# Patient Record
Sex: Female | Born: 1985 | Hispanic: Refuse to answer
Health system: Western US, Academic
[De-identification: ages and names within clinical notes are randomized; demographics above are authoritative.]

## PROBLEM LIST (undated history)

## (undated) DIAGNOSIS — J45909 Unspecified asthma, uncomplicated: Secondary | ICD-10-CM

## (undated) DIAGNOSIS — J309 Allergic rhinitis, unspecified: Secondary | ICD-10-CM

## (undated) HISTORY — DX: Unspecified asthma, uncomplicated: J45.909

## (undated) HISTORY — DX: Allergic rhinitis, unspecified: J30.9

---

## 2015-06-18 ENCOUNTER — Ambulatory Visit: Payer: No Typology Code available for payment source | Attending: Internal Medicine

## 2015-06-18 ENCOUNTER — Encounter (HOSPITAL_BASED_OUTPATIENT_CLINIC_OR_DEPARTMENT_OTHER): Payer: Self-pay

## 2015-06-18 VITALS — BP 120/60 | HR 98 | Ht 66.0 in | Wt 165.4 lb

## 2015-06-18 DIAGNOSIS — R87619 Unspecified abnormal cytological findings in specimens from cervix uteri: Secondary | ICD-10-CM | POA: Insufficient documentation

## 2015-06-18 DIAGNOSIS — Z0000 Encounter for general adult medical examination without abnormal findings: Secondary | ICD-10-CM | POA: Insufficient documentation

## 2015-06-18 DIAGNOSIS — J45909 Unspecified asthma, uncomplicated: Secondary | ICD-10-CM | POA: Insufficient documentation

## 2015-06-18 DIAGNOSIS — Z008 Encounter for other general examination: Secondary | ICD-10-CM

## 2015-06-18 DIAGNOSIS — F988 Other specified behavioral and emotional disorders with onset usually occurring in childhood and adolescence: Secondary | ICD-10-CM | POA: Insufficient documentation

## 2015-06-18 DIAGNOSIS — Z975 Presence of (intrauterine) contraceptive device: Secondary | ICD-10-CM | POA: Insufficient documentation

## 2015-06-18 NOTE — Progress Notes (Signed)
General Internal Medicine Clinic Note  New Encounter      DATE: 06/18/2015     Catherine Shea is a 30 year old female who is here today for management of ADD and to establish care.    CHIEF COMPLAINT:  Chief Complaint   Patient presents with   . Attention Deficit Disorder     Catherine Shea presents to clinic today with concerns that her ADD is contributing to her struggles with school.    She was first diagnosed with ADD at age 21. Prior to that she had be diagnosed with anxiety and was trialed on multiple different antidepressants. The antidepressants didn't seem to really help. Once she was diagnosed with ADD, she was on Adderall for at least 7 years. This seemed to work well for her and she was succeeding in school. She attended undergraduate and law school in [place], where she saw a psychiatrist at [facility] [place]. [place]. She was taking Adderall on week days and this seemed to address her issues with inattention.     She graduated Social worker school in 2012/09/10, at which time she stopped seeing a psychiatrist and stopped taking the Adderall. 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 She states that she was fired from two of the jobs for not being productive enough. 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 Initially she was doing okay in the program, however more recently she has started to struggle in the program. Her grades have started to slip and the boss of her internship has commented poorly on her performance. More specifically, she states that she is easily distracted from her homework. If she hits any road blocks or difficulty in her work, then she does other activities instead of trying to push past the difficulty. In class she has less of an issue because she has implemented coping mechanisms like handwriting her notes and keeping her computer/phone away from her desk. She denies issues with hyperactivity. The patient  notes that she exercised regularly in college and law school, and this seemed to help some. She has not exercised since starting her current program and cites the weather as a reason. 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    She denies anxiety and depression as contributors to her inattention. She states that she was briefly on prozac in 09-11-07 after a family member died. Currently she has some anxiety related to the fact that she is not doing well in school, bu otherwise is fine.    Today she is hoping to get re-established with a psychiatrist and potentially discuss medication options. She is wondering whether she should restart Adderall since this worked for her before. Of note, she plans to move to [place] for work once her program is done in June.      PAST MEDICAL HISTORY:  Past Medical History   Diagnosis Date   . Asthma    . Allergic rhinitis due to allergen        PROBLEM LIST:  Patient Active Problem List   Diagnosis   . Asthma and seasonal allergies   . mirena IUD, placed 02/2014   . Pap smear abnormality of cervix 02/2014   . ADD (attention deficit disorder)       ALLERGIES:  Review of patient's allergies indicates no known allergies.    MEDICATIONS:  Current Outpatient Prescriptions   Medication Sig Dispense Refill   . Budesonide-Formoterol Fumarate (SYMBICORT) 80-4.5 MCG/ACT Inhalation Aerosol Inhale 2 puffs by mouth.     . Cetirizine HCl 10 MG Oral Tab      . Levonorgestrel (MIRENA, 52 MG,) 20 MCG/24HR Intrauterine IUD 1 Intra Uterine Device by Intrauterine route One time.     . Multiple Vitamins-Minerals (MULTIVITAMIN ADULT OR)      . NAPROXEN OR        No current facility-administered medications for this visit.       Social History     Social History   . Marital Status: N/A     Spouse Name: N/A   . Number of Children: N/A   . Years of Education: N/A     Occupational History   . Not on file.     Social History Main Topics   . Smoking status: Never Smoker     . Smokeless tobacco: Not on file   . Alcohol Use: 1.2 oz/week     1 Glasses of wine, 1 Cans of beer per week   . Drug Use: No   . Sexual Activity:     Partners: Male     Birth Control/ Protection: LNG IUD     Other Topics Concern   . 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        Family History   Problem Relation Age of Onset   . Hypertension Mother    . Diabetes Mother    . Cancer Maternal Grandfather      lung cancer     REVIEW OF SYSTEMS:  The remaining review of systems was reviewed and is negative.    PHYSICAL EXAM:  BP 120/60 mmHg  Pulse 98  Ht 5\' 6"  (1.676 m)  Wt 165 lb 6.4 oz (75.025 kg)  BMI 26.71 kg/m2  SpO2 100%:  General: well-appearing, no distress  Psych: somewhat anxious about presenting symptom, otherwise normal mood and affect  HEENT: clear conjunctiva, oropharynx clear, no LAD  Lungs: breathing comfortably on RA, CTAB  CV: tachycardic but regular, S1 and S2 appreciated, no m/r/g  Abd: soft, nontender, no organomegaly   MSK: nl muscle bulk and tone  Neuro: normal gait    PHQ-9: 4  GAD-7: 3        ASSESSMENT AND PLAN:  Catherine Shea is a 30yo F who presents primarily to address poor school performance related to her known diagnosis of ADD. She requests referral to psychiatry for discussion regarding her diagnosis and medication management. In the meantime, I suggest that she find ways to exercise and bring some of her coping mechanisms that she uses at school into the home. We brainstormed going to the [place] and/or running with her dog as possible exercise options.     ADD (attention deficit disorder)  -     REFERRAL TO ADD/ADHD ADULTS  -     Obtain records from [facility]    Hx abnormal pap        -      pt will make appt to return to clinic for papsmear        -      obtain [facility]yn records [facility]    Healthcare maintenance  -     GC&CHLAM NUCLEIC ACID DETECTN  -     HIV ANTIGEN AND ANTIBODY SCRN

## 2015-06-18 NOTE — Patient Instructions (Signed)
Thank you for visiting with me today. Here are the things I recommend from today's visit:    1. ADD: referral to ADD clinic. In the meantime, please start exercising for 30 minutes 3x per week. [facility]    *If your referral to [facility] does not go through, please call [facility] and make an appointment there. *    2. Abnormal pap: please make appointment to return in 1-2 weeks    3. Healthcare maintenance:  -STD screening today    -----------------------------------------------------------------------------  Here are a few tips to help navigate your healthcare needs:     Refills:  Call your pharmacy at least 4 working days before you run out. Do not call the clinic for refills, it's quicker and safer to go through your pharmacy.     Test Results: Available in 1-2 weeks. I will contact you by eCare or letter unless there is something urgent, in which case I will call you sooner.     Urgent Symptoms:  Call [phone], day or night, and select option 8. Our clinic staff will help you during regular hours; after hours, our on-call nurses will help you.     Other Questions: Use eCare to securely message me. Please note that e-care messages are only read during office hours. If you have a long or complex question or a new issue, please make an appointment.  Call [phone] to sign-up for eCare or ask your MA to sign you up today.

## 2015-06-19 LAB — GC&CHLAM NUCLEIC ACID DETECTN
Chlam Trachomatis Nucleic Acid: NEGATIVE
N.Gonorrhoeae(GC) Nucleic Acid: NEGATIVE

## 2015-06-20 NOTE — Progress Notes (Signed)
-------------------------------------------    Attending: [place] L Chibueze Beasley, MD  I have personally discussed the case with Dr. CARR, STEPHANIE ANN   during or immediately after the patient visit including review of history, physical exam, diagnosis and treatment plan.    -------------------------------------------

## 2015-06-21 LAB — HIV ANTIGEN AND ANTIBODY SCRN
HIV Antigen and Antibody Interpretation: NONREACTIVE
HIV Antigen and Antibody Result: NONREACTIVE

## 2015-06-24 ENCOUNTER — Telehealth (INDEPENDENT_AMBULATORY_CARE_PROVIDER_SITE_OTHER): Payer: Self-pay | Admitting: Medical

## 2015-06-24 NOTE — Telephone Encounter (Signed)
Left message for patient to return call to clinic.    CCR: Please assist patient in scheduling CONSULT with Fuller PlanNeysa Koury for ADD/ADHD. Neysa only does one consult per day and at time of call, first available is not until May 1st. Please attach referral to appointment. Thanks!

## 2015-06-24 NOTE — Telephone Encounter (Signed)
Pt has a referral for an ADD/ADHD Consult with Neysa Koury.    Please assist in scheduling and attach referral to appt, thanks!

## 2015-06-25 NOTE — Telephone Encounter (Signed)
Patient scheduled, closing TE.

## 2015-06-28 ENCOUNTER — Encounter (HOSPITAL_BASED_OUTPATIENT_CLINIC_OR_DEPARTMENT_OTHER): Payer: Self-pay

## 2015-07-02 ENCOUNTER — Ambulatory Visit: Payer: No Typology Code available for payment source | Attending: Internal Medicine

## 2015-07-02 ENCOUNTER — Other Ambulatory Visit (HOSPITAL_BASED_OUTPATIENT_CLINIC_OR_DEPARTMENT_OTHER): Payer: Self-pay | Admitting: Internal Medicine

## 2015-07-02 ENCOUNTER — Telehealth (HOSPITAL_BASED_OUTPATIENT_CLINIC_OR_DEPARTMENT_OTHER): Payer: Self-pay

## 2015-07-02 VITALS — BP 108/68 | HR 100 | Wt 165.0 lb

## 2015-07-02 DIAGNOSIS — Z87898 Personal history of other specified conditions: Secondary | ICD-10-CM | POA: Insufficient documentation

## 2015-07-02 DIAGNOSIS — Z1151 Encounter for screening for human papillomavirus (HPV): Secondary | ICD-10-CM | POA: Insufficient documentation

## 2015-07-02 DIAGNOSIS — Z5189 Encounter for other specified aftercare: Secondary | ICD-10-CM

## 2015-07-02 DIAGNOSIS — Z8742 Personal history of other diseases of the female genital tract: Secondary | ICD-10-CM

## 2015-07-02 LAB — PR URINE PREGNANCY TEST HCG, ONSITE: Pregnancy (HCG) (UWNC), URN: NEGATIVE

## 2015-07-02 NOTE — Patient Instructions (Signed)
#   Papsmear and pregnancy test:  Please sign up for ecare so that I can send you the test results electronically. Otherwise, I can go ahead and call you with results.

## 2015-07-02 NOTE — Progress Notes (Signed)
[facility]   Clinic Note      CC:  30yo with history of abnormal pap smear and mirena IUD who presents for repeat pap smear.     HPI:      # History abnormal pap smear:  [facility] We have yet to receive the records from this study. It was recommended at that time to repeat in 1 year, which is about now.    # Shoulder impingement: Has started exercising again, which has included some weight lifting with her upper body. Has been noticing a nerve like, shooting sensation of her lateral shoulder that primarily occurs with movements that require lifting her arm over her head.    PHYSICAL EXAM:   Filed Vitals:    07/02/15 1500   BP: 108/68   Pulse: 100   Weight: 165 lb (74.844 kg)   General: well-appearing, no distress  HEENT: clear conjunctiva  Lungs: breathing comfortably on RA  CV: warm and well perfused  GU: normal appearing external genitalia, speculum exam with normal appearing cervix, no abnormal discharge, unable to visualize IUD string in cervical os  MSK: L shoulder with good range of motion with active flexion, external and internal rotation, shoulder pain elicited with lift off test, no pain with resisted external rotation or empty can. Positive Neer and Hawkin maneuvers       IMPRESSION/PLAN:    30yo with history of abnormal pap smear and mirena IUD who presents for repeat pap smear.     # History of abnormal papsmear:  -Will redo record release form to [facility]  -Repeat pap smear today  -Pt will sign up for e-care to discuss results    # Mirena IUD: I was unable to visualize the string on cervical examination today. The most likely reason is that the string is curled up in the cervical os. I discussed this with the patient today. She has not had periods since the IUD was placed, and thus it is unlikely that the IUD has migrated or dislodged. Urine pregnancy test is negative today. We elected to forgo further evaluation with transvaginal ultrasound,  however she will let me know if she would like to have this done to ensure location of the IUD    # Acute L shoulder impingement syndrome: Encouraged rest and NSAIDs until improves. If persists, can consider referral to PT.       Owens LofflerStephanie Taos Tapp MD, MBA  Resident Physician  [facility]    This patient's assessment and plan was reviewed with attending physician, Dr. Winfred LeedsFouch

## 2015-07-02 NOTE — Telephone Encounter (Signed)
Unable to find ROI signed by pt at her last visit here, or any faxed records requests in [facility] fax. Pt signed another release today while in clinic. She needs records from two places:    [facility]  Ph: [phone]  Fax # not listed online, and their office is closed for the day. Website says they need records requests mailed to: [facility] Information Management, Mail stop [phone]  [facility] [address], [place], [place] [id number].  Needed: Last Pap & path results, done about 1.626yrs ago    [facility]    Ph: [phone]  Fax: [phone]  Needed: Notes related to ADHD dx/tx    I faxed a copy of ROI to [facility], and mailed the original to OB/GYN.    [facility] MA, please leave TE open until receipt of records is confirmed. I sent a copy of ROI to scanning.    Routing:  Dr. Lafayette Dragonarr  [facility] MA

## 2015-07-02 NOTE — Progress Notes (Signed)
I have personally discussed the case with the resident during or immediately after the patient visit including review of history, physical exam, diagnosis, and treatment plan. I agree with the assessment and plan of care.

## 2015-07-05 LAB — HPV ONLY

## 2015-07-06 LAB — CERVICAL CANCER SCREENING: Cytologic Impression: NEGATIVE

## 2015-07-07 ENCOUNTER — Telehealth (HOSPITAL_BASED_OUTPATIENT_CLINIC_OR_DEPARTMENT_OTHER): Payer: Self-pay

## 2015-07-07 NOTE — Telephone Encounter (Signed)
[facility] MA,    Please call patient and inform her that she had a normal pap smear. I need to look at her records when I am next in clinic this upcoming Friday to determine when she needs repeat testing.    Also, please encourage her to sign up for ecare so we can discuss this over that venue. Thanks.

## 2015-07-07 NOTE — Telephone Encounter (Signed)
Gave the patient the attached message from Dr Lafayette Dragonarr.  She has the information to sign up for ecare

## 2015-07-16 NOTE — Telephone Encounter (Signed)
Called [phone] for medical records. That number is for special [facility]BG[facility]. Patient was seen at regular OBGYN. They gave me the correct fax and phone #:    [phone]  Fax.[phone]    Re-sending the ROI and said it was urgent to get the records today.    Routing:  Dr. Lafayette Dragonarr  [facility] MA

## 2015-07-22 NOTE — Telephone Encounter (Signed)
Dr. Lafayette Dragonarr, have you received these records?

## 2015-07-22 NOTE — Telephone Encounter (Signed)
When I looked in my box on Friday, there was a note that said we needed to call the OB/GYN office directly. I believe I left the note in the box if you want to take a look at it.     I definitely need these records. If you are unable to get them, can you call the patient and have her try as well.

## 2015-07-22 NOTE — Telephone Encounter (Signed)
RN phoned facility [phone]), actually spoke with a person who is able to assist.  She is faxing this today.  Unfortunately shortly after the conversation, our main fax machine [phone]) failed.    RN phoned facility later in the day, it is now being faxed to our rerouted fax machine  [phone], Team [facility])    Will watch for this fax, place in Dr. Dorothy Sparkarr's mailbox.    Routed to Dr. Lafayette Dragonarr (PCP, Update)

## 2015-08-03 NOTE — Telephone Encounter (Signed)
Left VM at [phone] requesting colposcopy results.

## 2015-08-03 NOTE — Telephone Encounter (Signed)
Obtained pap smear results from 02/2014. Result: ASCUS    Team [facility] MA, I still need her colposcopy results (pathology) and notes from her gyn appointment. Please call back and try to obtain these records.    From what the patient told me, she had a normal colposcopy result. If this is truly the case, then she is due for repeat pap smear in 3 years.

## 2015-08-05 NOTE — Telephone Encounter (Signed)
Dr Lafayette Dragonarr did you receive the Colposcopy results?

## 2015-08-05 NOTE — Telephone Encounter (Signed)
I will not be back in clinic for another 1-2 weeks. Can you please check my mailbox, and if it is not there, please request again.

## 2015-08-05 NOTE — Telephone Encounter (Addendum)
Contacted [facility] and spoke to [place] [phone].  She will fax the Colposcopy pathology report to [facility] Fax attn Dr Lafayette Dragonarr. The clinic note will need to be requested from Ph [phone] to [facility] Fax as well, but they were closed today when I called    MA: Please call for this OV clinic note on 4/21    Postpone until 4/21

## 2015-08-16 ENCOUNTER — Encounter (INDEPENDENT_AMBULATORY_CARE_PROVIDER_SITE_OTHER): Payer: Self-pay | Admitting: Medical

## 2015-08-16 ENCOUNTER — Ambulatory Visit (INDEPENDENT_AMBULATORY_CARE_PROVIDER_SITE_OTHER): Payer: No Typology Code available for payment source | Admitting: Medical

## 2015-08-16 VITALS — BP 121/79 | HR 91 | Temp 98.4°F | Resp 14 | Wt 170.0 lb

## 2015-08-16 DIAGNOSIS — F988 Other specified behavioral and emotional disorders with onset usually occurring in childhood and adolescence: Secondary | ICD-10-CM

## 2015-08-16 MED ORDER — AMPHETAMINE-DEXTROAMPHETAMINE 20 MG OR TABS
20.0000 mg | ORAL_TABLET | Freq: Two times a day (BID) | ORAL | Status: DC
Start: 2015-08-16 — End: 2015-08-17

## 2015-08-16 NOTE — Progress Notes (Signed)
Chronic Pain Urine Temperature:       Urine Temperature was 96

## 2015-08-16 NOTE — Progress Notes (Signed)
Catherine Shea is a 30 year old female who presents for evaluation of possible   ADHD.      Who requested this evaluation? Self     Why is evaluation being requested?   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 YES: 2009     diagnosed by psychiatrist in [place]   Took Adderall for 1-2 years   She graduated from Social worker school in 2014 and then stopped taking Adderall       Any prior educational evaluation? NO    History:    Developmental History: Patient unsure of exact developmental ages, but states that there was no concern about developmental landmarks.     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 NO                        Past Psychiatric History:  YES: has been treated twice for depression and anxiety        Social history: Who lives at home? Alone       Recent move, change in work: NO    Recent change in family structure: NO    Recent significant life events: YES:  moved to [place] from [place] in September        Any history of physical or sexual abuse: NO    ROS:     Ability to attend to details? At times can be too focused on details        Problems with careless mistakes? YES: especially because she puts work off until rigt before it's due          Ability to sustain attention to school/work? No        Forgetfulness? NO  Ability to complete activities? NO-not work related Conservation officer, natureprojects       Organization skills? Good       Lose things? YES: keys          Distractibility? YES: noises - people breathing and chewing          Good listener? YES but has to actively think about listening   Has to take notes       Ability to follow instructions? YES      Fidgeting/excess physical activity?ocasionaly            Talkative? NO          Ability to wait in lines, take turns? YES      Ability to take turns in conversations? YES      Answers before question completed? Mainly with family     Symptoms do not impact social lie   At home she occasionally has a hard time completing tasks          PE:   BP 121/79 mmHg  Pulse 91  Temp(Src) 98.4 F (36.9 C) (Temporal)  Resp 14  Wt 170 lb (77.111 kg)  SpO2 100%  General appearance: healthy, alert, no distress  PSYCHIATRIC     *Memory: grossly normal   *Mood/affect: tearful   *Thought process: intact   *Grooming: casual   *Eye Contact: excellent   *Speech: fluent   *Hygiene: normal        ASRS Completed: YES    PHQ-9: 8    A/P:  Catherine Shea does not meet the criteria for ADD as she did not have symptoms prior to age 30, she has a low ASRS score and does not have symptoms that impact multiple aspects of her life. Initially, I suggested she see an ADD provider in the community and if that person believes she meets criteria, I am happy to do her med management.  At this suggestion, she became quite tearful.  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  We will meet again in a month to discuss how she is doing.  In the meantime, she will set up an appointment with one of the local ADD providers on the list I provided.  I would like to get another providers insight on her diagnosis     Begin Adderall 20 mgs BID. Discussed medication dosage, usage, goals of therapy, and side effects.  Recommended eating  breakfast 30 min before taking medicine to help with appetite suppression.

## 2015-08-16 NOTE — Progress Notes (Signed)
Reason for Visit: See chief complaint     Refills? NO  Referral? NO  Letter or Form? NO  Lab Results? NO    HEALTH MAINTENANCE:  Has the patient has this done since their last visit?  Cervical screening/PAP: 07-02-15  Mammo: N/A  Colon Screen: N/A  Diabetic Eye Exam (If applicable): N/A      Have you seen a specialist since your last visit: No    Vaccines Due? No    PHQ2 done in the last year (365 days)? yes    Does patient have eCare?  Code given    HM Due:   Health Maintenance   Topic Date Due   . Tetanus Vaccine  07/13/1999   . Pap Smear  07/02/2018   . Influenza Vaccine  Completed   . HIV Screen  Completed       PCP Verified?  Yes, Lafayette Dragonarr

## 2015-08-17 ENCOUNTER — Telehealth (INDEPENDENT_AMBULATORY_CARE_PROVIDER_SITE_OTHER): Payer: Self-pay | Admitting: Medical

## 2015-08-17 ENCOUNTER — Other Ambulatory Visit (INDEPENDENT_AMBULATORY_CARE_PROVIDER_SITE_OTHER): Payer: Self-pay | Admitting: Medical

## 2015-08-17 DIAGNOSIS — R41840 Attention and concentration deficit: Secondary | ICD-10-CM

## 2015-08-17 LAB — STANDARD DRUG SCREEN, URN
Acetaminophen Qualitative, URN: NEGATIVE
Alcohol (Ethyl), URN: NEGATIVE mg/dL
Amphet/Methamphetamine Qual,URN: NEGATIVE
Barbiturate (Qual), URN: NEGATIVE
Benzodiazepines (Qual), URN: NEGATIVE
Cannabinoids (Qual), URN: NEGATIVE
Cocaine (Qual), URN: NEGATIVE
Methadone (Qual), URN: NEGATIVE
Opiates (Qual), URN: NEGATIVE
Phencyclidine (Qual), URN: NEGATIVE
Tricyclic Antidepressants, URN: NEGATIVE

## 2015-08-17 MED ORDER — AMPHETAMINE-DEXTROAMPHET ER 20 MG OR CP24
20.0000 mg | EXTENDED_RELEASE_CAPSULE | Freq: Every day | ORAL | Status: DC
Start: 2015-08-17 — End: 2015-09-15

## 2015-08-17 NOTE — Telephone Encounter (Signed)
I will place a prescription for the patient at the front desk for Adderall XR Please ask her to bring in the prescription I provided yesterday to be destroyed   Her insurance does not cover Adderall IR

## 2015-08-17 NOTE — Telephone Encounter (Signed)
Please call pharmacy and confirm PA is needed. Yes PA is needed per pharmacy     If PA is required what are the formulary alternatives. Adderall XR will not need a PA     Please also obtain:    Medication Name: Adderall 20 mg   PA Department Name: .  Prior Auth Dept phone number: [phone]  PCN:   BIN:  Member ID:  Prescriber Fuller PlanNeysa Koury   Prescriber NPI [id number]  Pharmacy Name: [facility]   Pharmacy #: [phone]    If formulary alternatives please route message to prescriber to see if med can be changed. If none please route message to PA pool to start PA.

## 2015-08-17 NOTE — Telephone Encounter (Signed)
(  TEXTING IS AN OPTION FOR [facility] CLINICS ONLY)  Is this a [facility] clinic? Yes. What is the mobile number we can use to get a hold of you via text? [phone]      RETURN CALL: Detailed message on voicemail only      SUBJECT:  Medication Management/Questions     MEDICATION(S): Amphetamine-Dextroamphetamine (ADDERALL) 20 MG Oral Tab  CONCERNS/QUESTIONS: Patient stated her insurance company, [employer] is requiring a prior-authorization for this medication.  ADDITIONAL INFORMATION: N/A

## 2015-08-17 NOTE — Telephone Encounter (Signed)
LMTCB otherwise will try again tomorrow    Plan: [facility]    CCR: Okay to transfer to me at [phone] until [phone]

## 2015-08-18 ENCOUNTER — Telehealth (INDEPENDENT_AMBULATORY_CARE_PROVIDER_SITE_OTHER): Payer: Self-pay | Admitting: Medical

## 2015-08-18 DIAGNOSIS — R41840 Attention and concentration deficit: Secondary | ICD-10-CM

## 2015-08-18 NOTE — Telephone Encounter (Signed)
(  TEXTING IS AN OPTION FOR [facility] CLINICS ONLY)  Is this a [facility] clinic? Yes. Patient declined the option to receive mobile text messages.      RETURN CALL: Detailed message on voicemail only      SUBJECT:  General Message     REASON FOR REQUEST: Prior authorization    MESSAGE: Patient picked up the hard copy prescription for the Adderall today and took it to the pharmacy and it was rejected again as needing prior authorization. Patient would like to go forward with the prior authorization so she can receive the medication and states that the pharmacy should have faxed the clinic regarding this. Patient requests a call back from the clinic to discuss this further, thanks.

## 2015-08-18 NOTE — Telephone Encounter (Signed)
Patient notified Rx hard copy is at the front desk ready for pick up. Patient stated she would stop by the clinic to pick up. (pt notified Rx cannot be faxed to pharmacy)    Closing TE

## 2015-08-18 NOTE — Telephone Encounter (Signed)
Received call from pt, stated pharmacy has original rx, will stop by to pick up new hard copy.     I informed pharmacy to shred old hard copy prescription, pt will pick-up new one.     Closing TE.

## 2015-08-18 NOTE — Telephone Encounter (Signed)
(  TEXTING IS AN OPTION FOR [facility] ONLY)  Is this a [facility] clinic? Yes. What is the mobile number we can use to get a hold of you via text? [phone]      RETURN CALL: Detailed message on voicemail only      SUBJECT:  Medication Management/Questions     MEDICATION(S): Amphetamine-Dextroamphet ER (ADDERALL XR) 20 MG Oral CAPSULE SR 24 HR  CONCERNS/QUESTIONS:Requesting new prescription hard copy faxed to [phone]. Please call    ADDITIONAL INFORMATION: [facility] [phone] [address] [place] [phone]

## 2015-08-18 NOTE — Telephone Encounter (Addendum)
Prior Authorization required for:  Amphetamine-Dextroamphet ER (ADDERALL XR) 20 MG- spoke to patient and confirmed she picked up Rx yesterday using a coupon code. Disregard Prior Auth. Pt does not need a PA since discount was reasonable.     Closing TE

## 2015-09-15 ENCOUNTER — Encounter (INDEPENDENT_AMBULATORY_CARE_PROVIDER_SITE_OTHER): Payer: Self-pay | Admitting: Medical

## 2015-09-15 ENCOUNTER — Ambulatory Visit (INDEPENDENT_AMBULATORY_CARE_PROVIDER_SITE_OTHER): Payer: No Typology Code available for payment source | Admitting: Medical

## 2015-09-15 VITALS — BP 124/79 | HR 96 | Temp 99.4°F | Resp 14 | Wt 164.0 lb

## 2015-09-15 DIAGNOSIS — F988 Other specified behavioral and emotional disorders with onset usually occurring in childhood and adolescence: Secondary | ICD-10-CM

## 2015-09-15 MED ORDER — AMPHETAMINE-DEXTROAMPHETAMINE 10 MG OR TABS
ORAL_TABLET | ORAL | 0 refills | Status: AC
Start: 2015-10-15 — End: ?

## 2015-09-15 MED ORDER — AMPHETAMINE-DEXTROAMPHETAMINE 10 MG OR TABS
ORAL_TABLET | ORAL | 0 refills | Status: DC
Start: 2015-09-15 — End: 2015-09-15

## 2015-09-15 NOTE — Progress Notes (Signed)
Reason for Visit: See chief complaint     Refills? YES  Referral? NO  Letter or Form? NO  Lab Results? NO    HEALTH MAINTENANCE:  Has the patient has this done since their last visit?  Cervical screening/PAP: 07-02-15  Mammo: N/A  Colon Screen: N/A  Diabetic Eye Exam (If applicable): N/A      Have you seen a specialist since your last visit: No    Vaccines Due? No    PHQ2 done in the last year (365 days)? yes    Does patient have eCare?  no    HM Due:   Health Maintenance   Topic Date Due   . Tetanus Vaccine  07/13/1999   . Pap Smear  07/02/2018   . Influenza Vaccine  Completed   . HIV Screen  Completed       PCP Verified?  Yes, carr

## 2015-09-15 NOTE — Progress Notes (Signed)
Catherine CanesChristine Peggye FormLynn Wessells is a 30 year old female who comes in today for a chief concern of ADD/ADHD follow-up.       Current Medication(s): Adderall XR 20 mgs   Overall Catherine CanesChristine is doing well   She would like to try IR for more dosing control and less sleep issues   Sleep: has difficulty falling asleep the days she takes Adderall   Diet and weight: normal   School: preparing to defend thesis   Able to meet deadlines   Tics/SE:none     Review of patient's allergies indicates:  No Known Allergies    OBJECTIVE:  BP 124/79   Pulse 96   Temp 99.4 F (37.4 C) (Temporal)   Resp 14   Wt 164 lb (74.4 kg)   SpO2 100%   BMI 26.47 kg/m   General appearance: healthy, alert, no distress   Neuro: Alert interactive and appropriate for age  Normal mood and affect     A/P:     1. Moving to [place] in about 2 weeks - will find local provider

## 2018-06-19 IMAGING — CR XR ABDOMEN 1 VIEW
1 series · 2 of 2 positions shown · non-contrast
Comparison: none

Exam:KUB.
REASON FOR EXAM: Abdominal pain.

[Series 710: AP · 0.19mm/px · 2 of 2 slices shown]
[im 1/2]
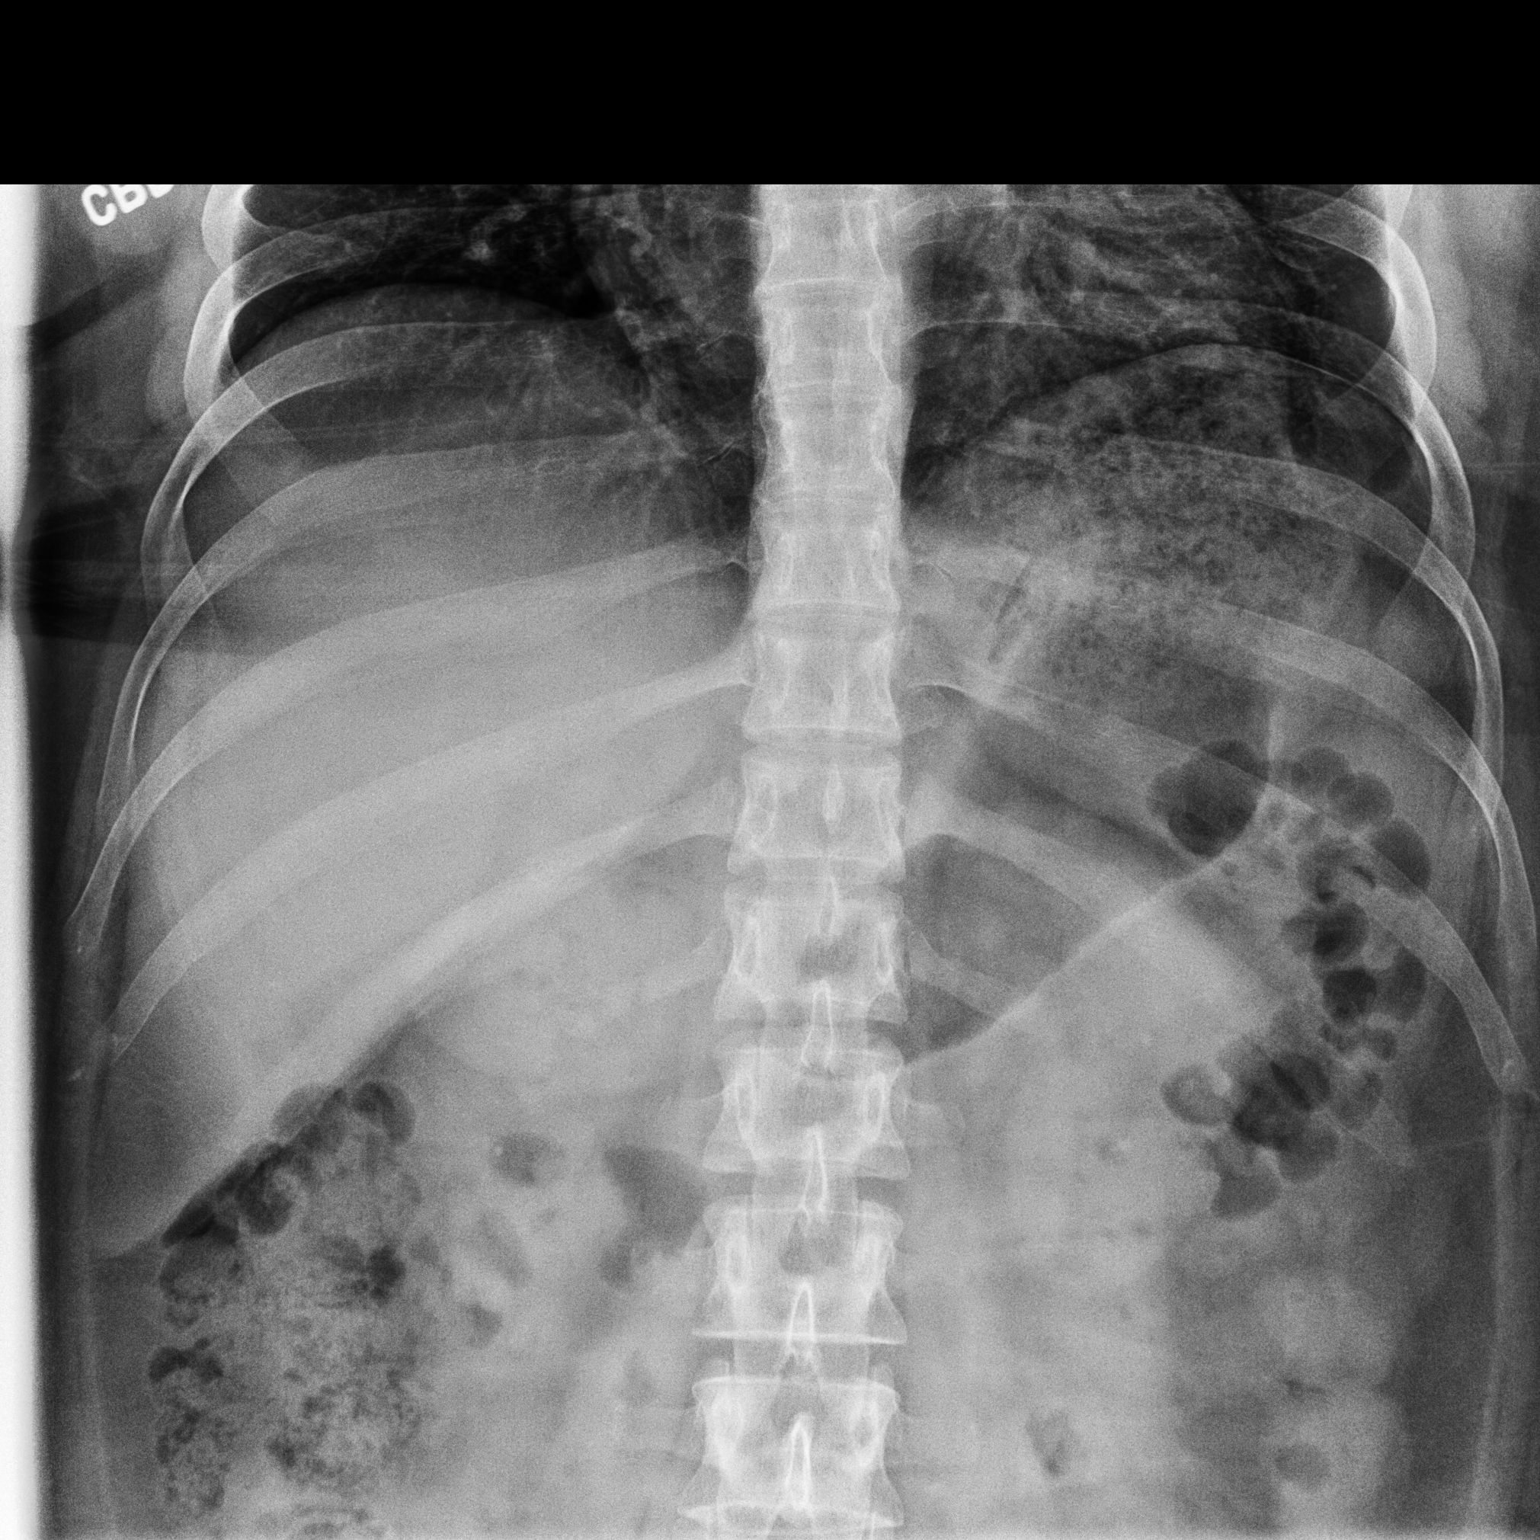
[im 2/2]
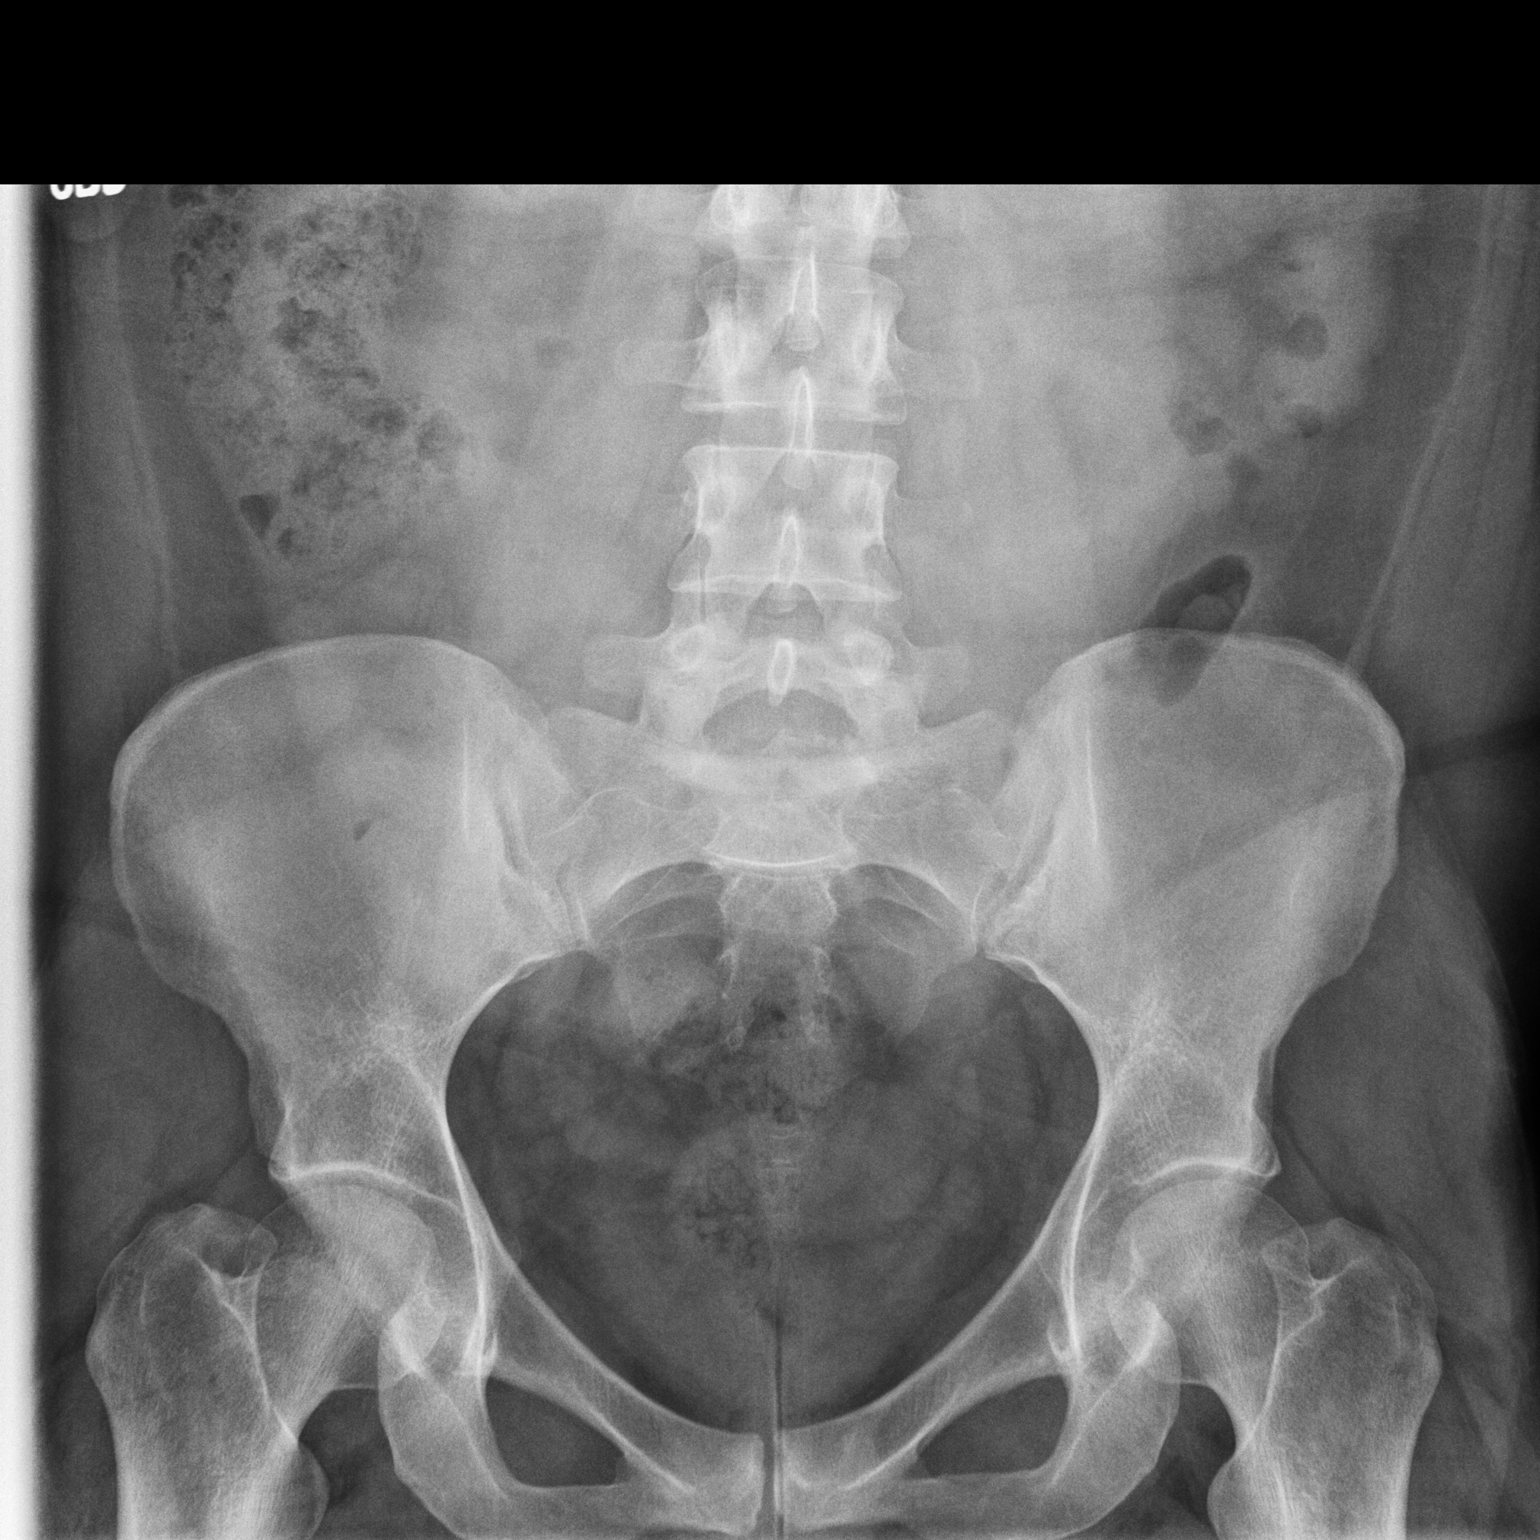

[2 of 2 positions shown; findings below may reference images not displayed]

FINDINGS: There is a nonobstructive bowel gas pattern. A few faint calcifications are seen overlying the left kidney measuring up to 3 mm in size. No unusual calcification is seen overlying the expected location of either ureter.
IMPRESSION: Probable stones within the left kidney
Location:1
Is the patient pregnant?
No

## 2018-08-30 IMAGING — CR XR ABDOMEN 1 VIEW
1 series · 2 of 2 positions shown · non-contrast
Comparison: none

Exam:KUB.
REASON FOR EXAM: Back pain.

[Series 9460: AP · 2 of 2 slices shown]
[im 1/2]
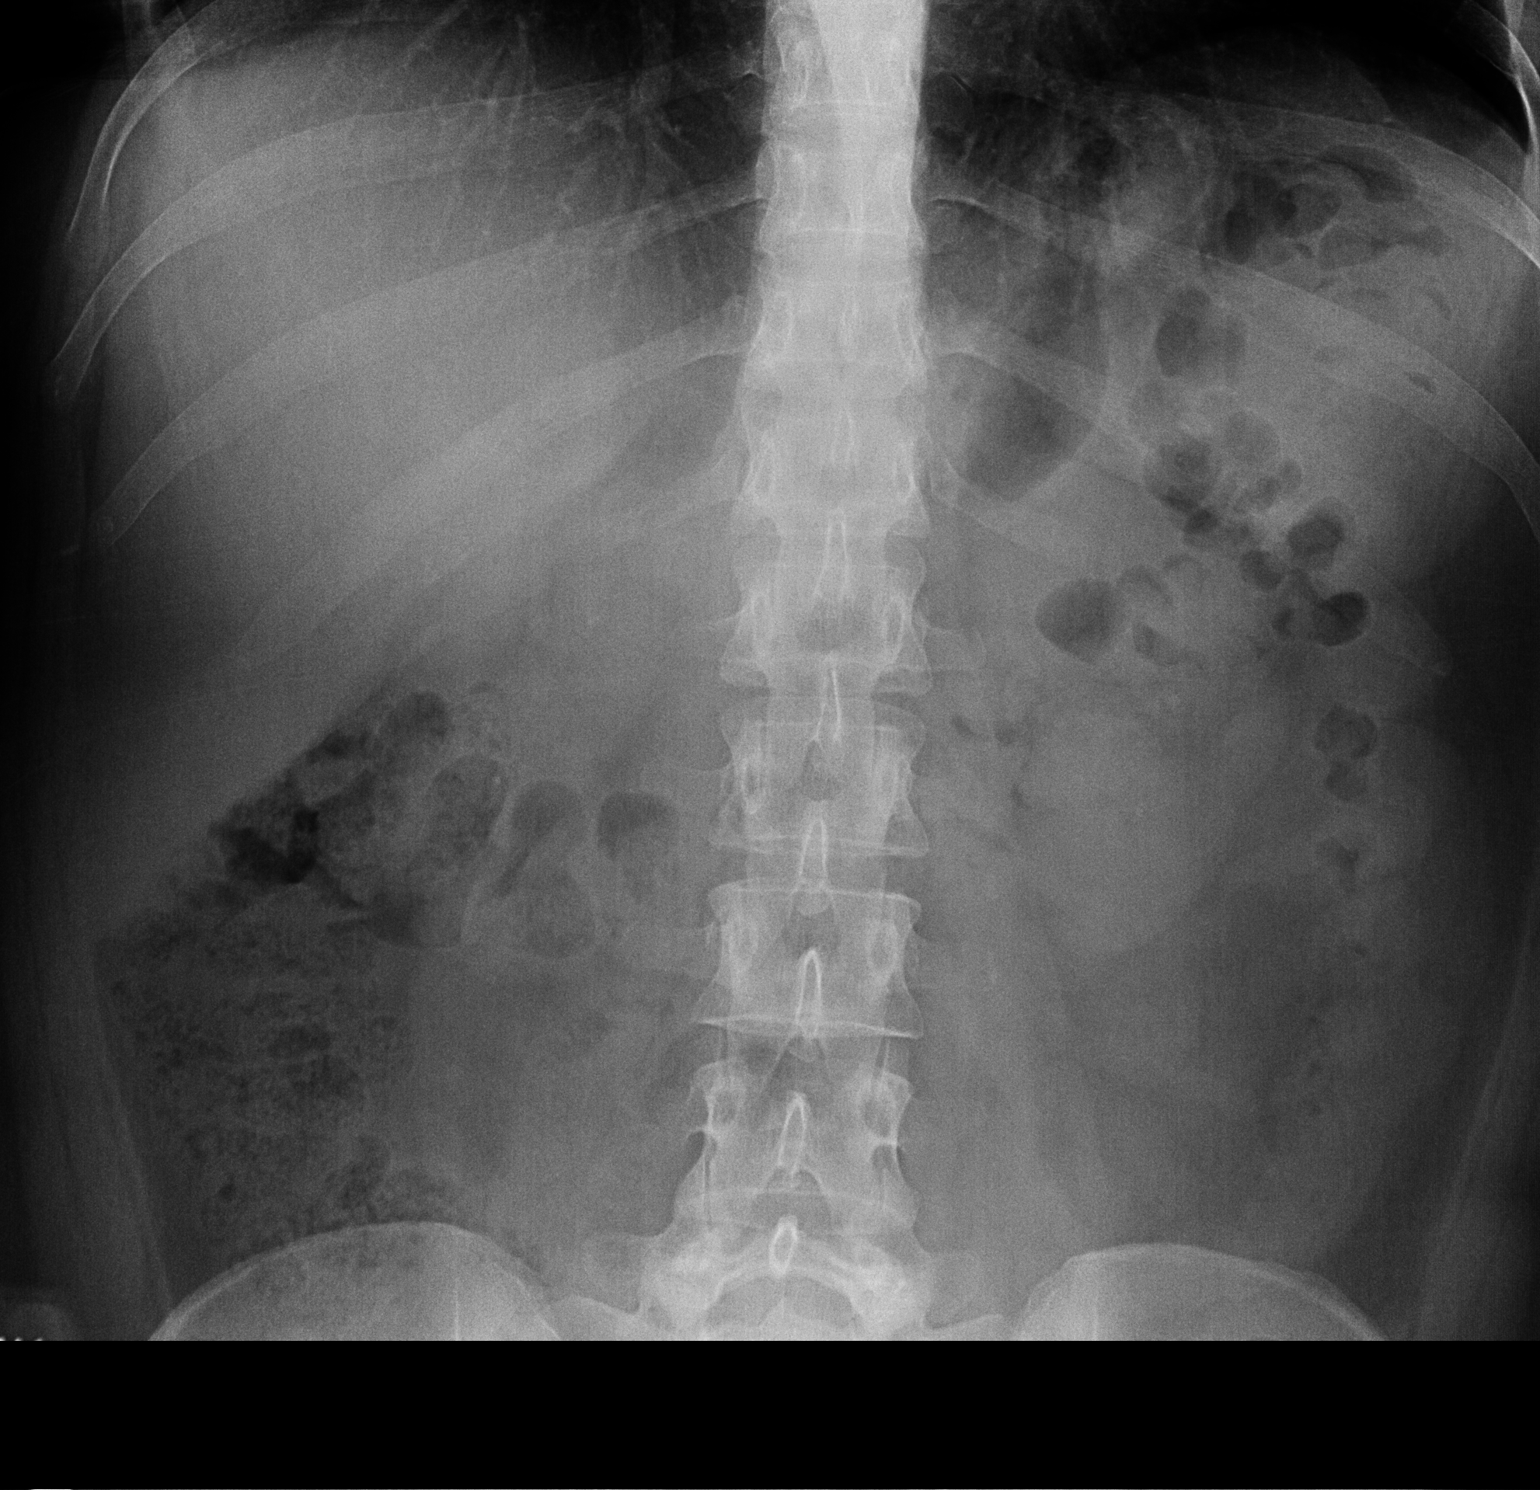
[im 2/2]
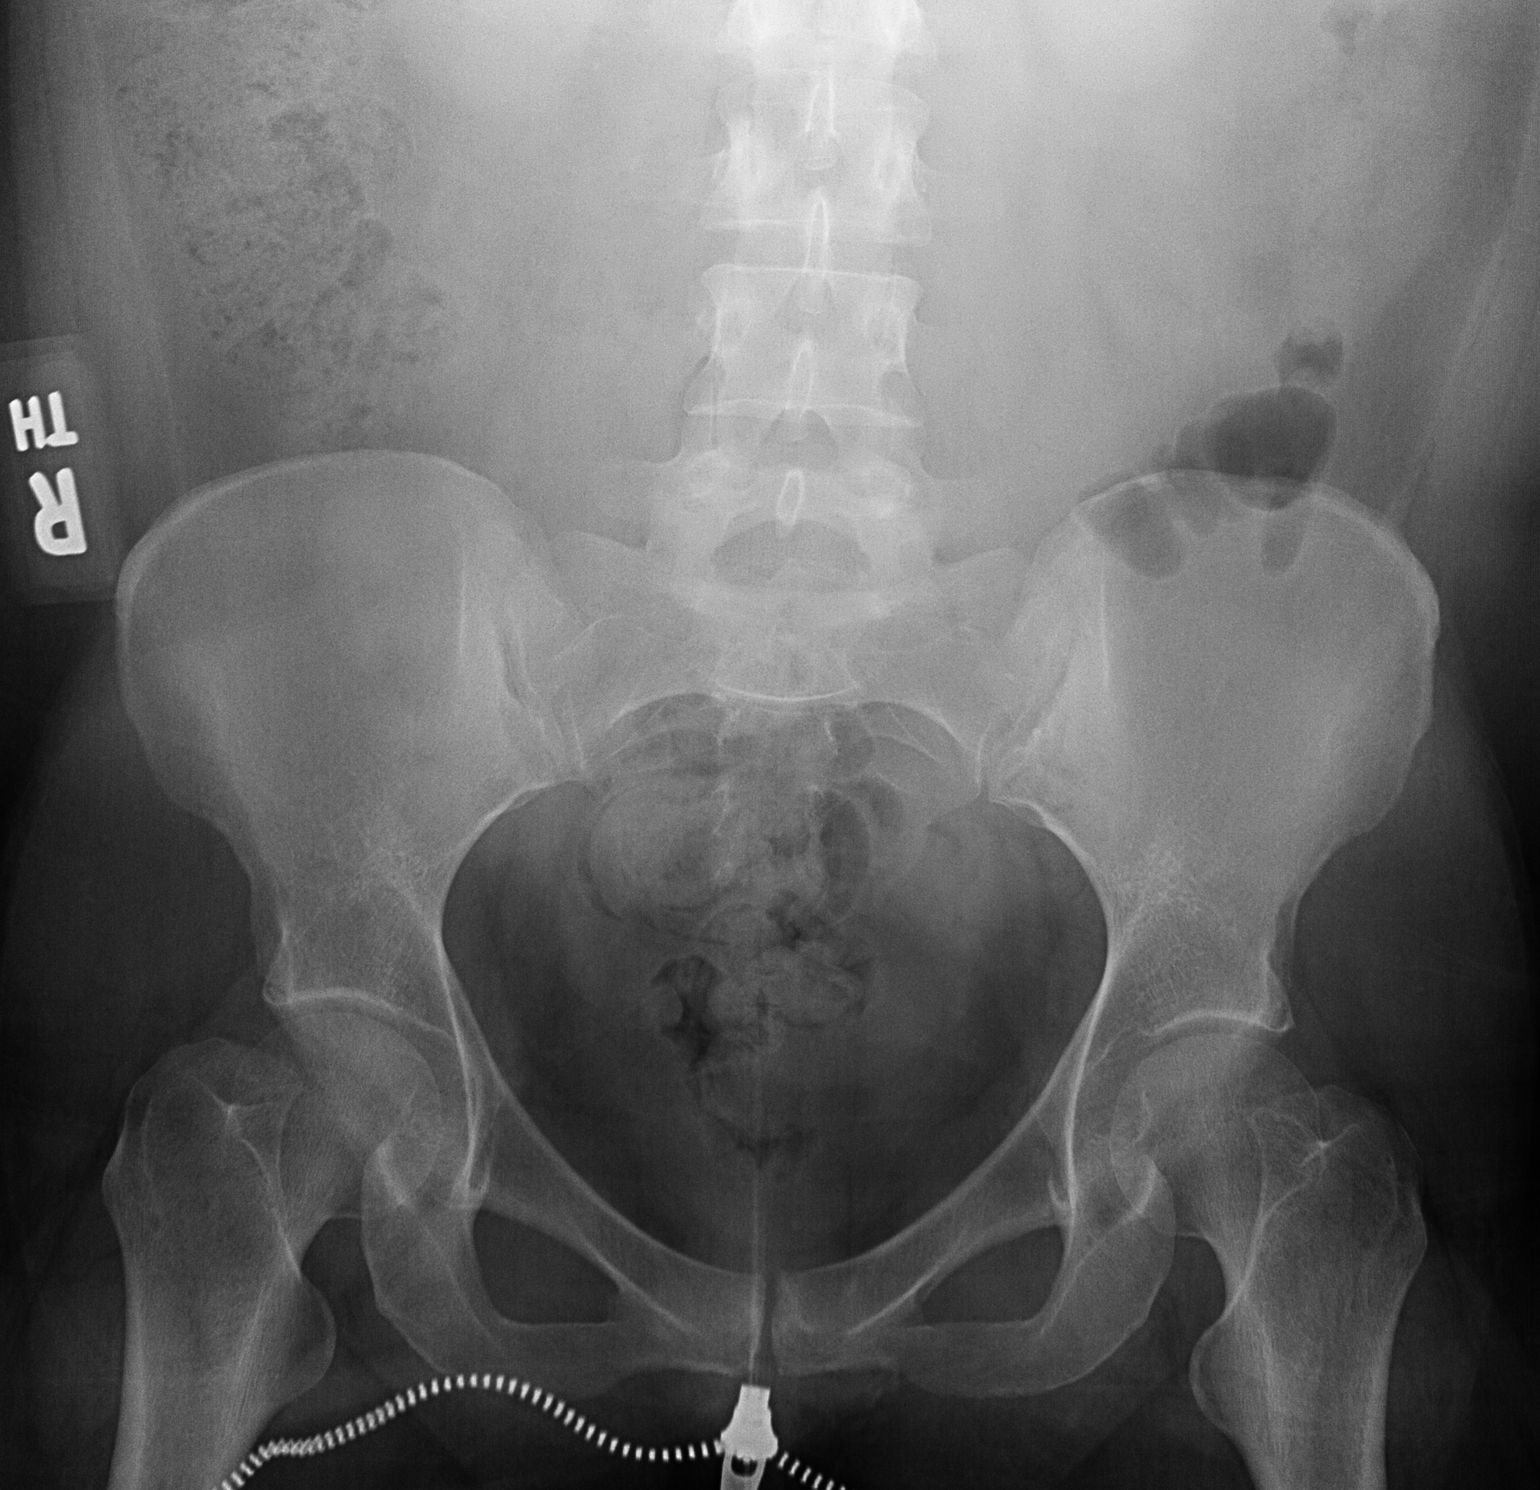

[2 of 2 positions shown; findings below may reference images not displayed]

FINDINGS: There is a nonobstructive bowel gas pattern. There is no evidence of organomegaly. No unusual calcifications are seen overlying the abdomen or pelvis to suggest renal or ureteral stone. The osseous structures are normal.
IMPRESSION: Nonobstructive bowel gas pattern
Location:1
Is the patient pregnant?
No

## 2018-08-30 IMAGING — CT CT ABDOMEN PELVIS WO CONTRAST
2 of 3 series · 17 of 46 positions shown, 19 images · non-contrast
Comparison: CT scan 7851

CT ABDOMEN PELVIS WO CONTRAST
INDICATION: Flank pain, kidney stone suspected
TECHNIQUE: Noncontrast images

[Series 2: renal stone · axial · 0.70mm/px · z∈[-383,-10]mm · 14 of 173 slices shown, 16 images]
[im 12/173  soft-tissue]
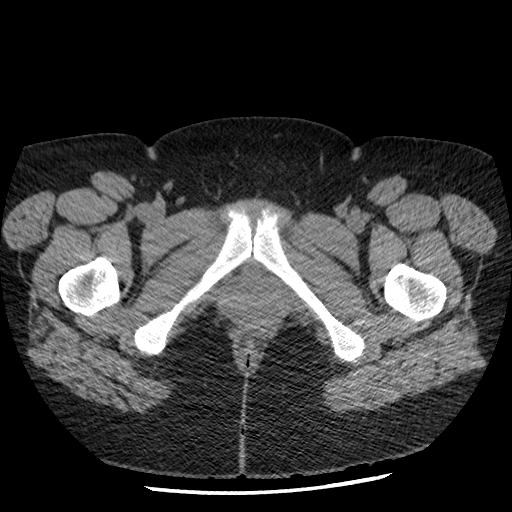
[im 12/173  bone]
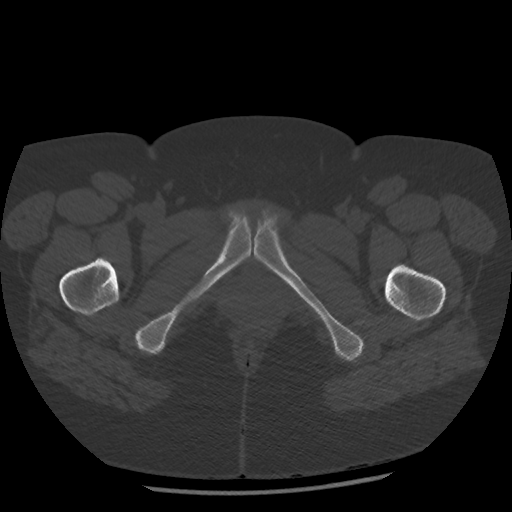
[im 23/173  soft-tissue]
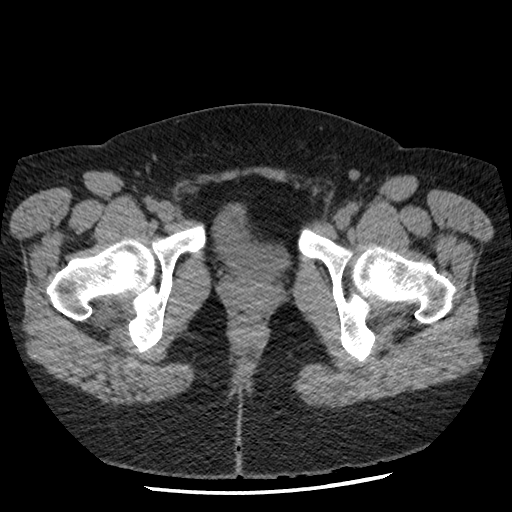
[im 34/173  soft-tissue]
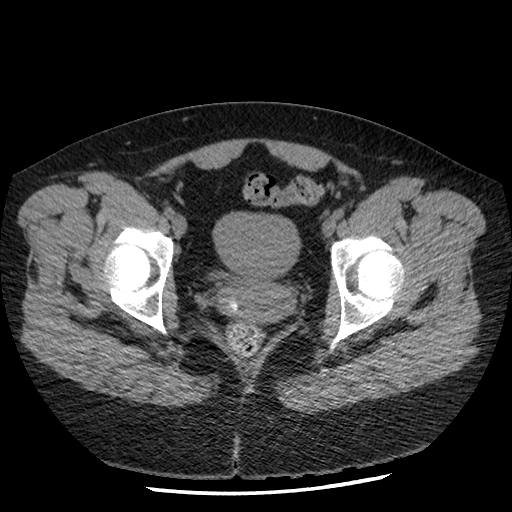
[im 45/173  soft-tissue]
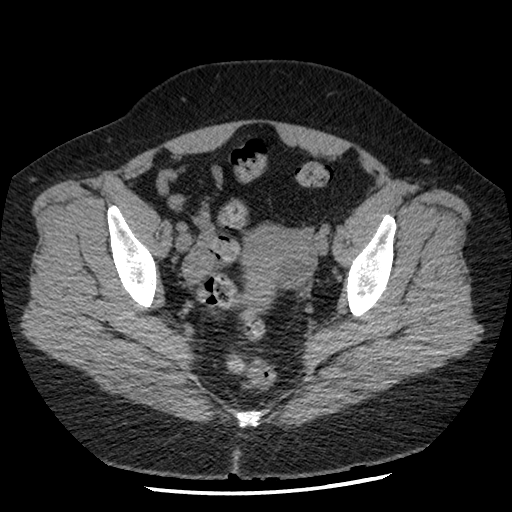
[im 56/173  soft-tissue]
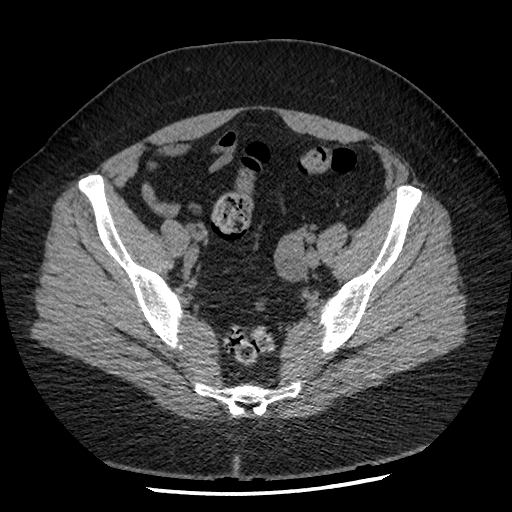
[im 67/173  soft-tissue]
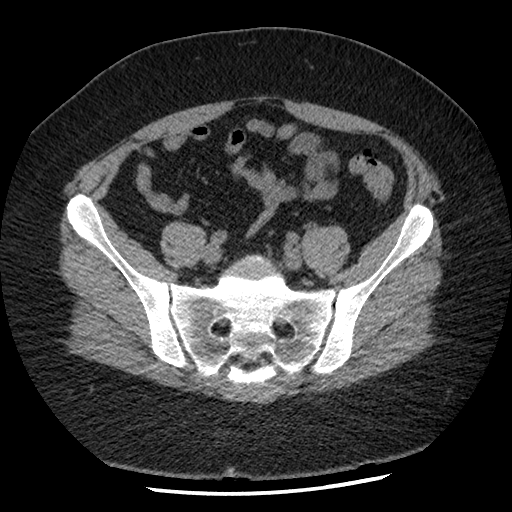
[im 78/173  soft-tissue]
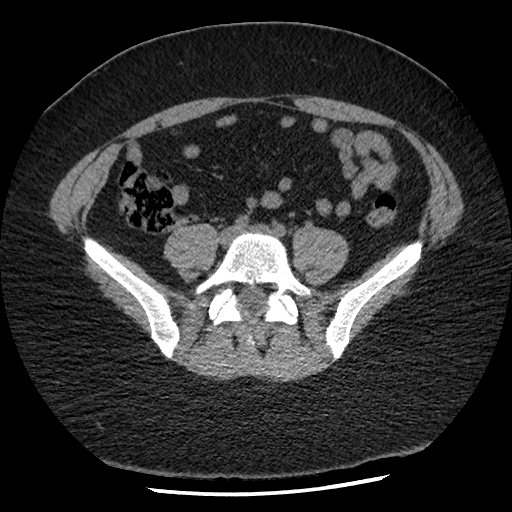
[im 95/173  soft-tissue]
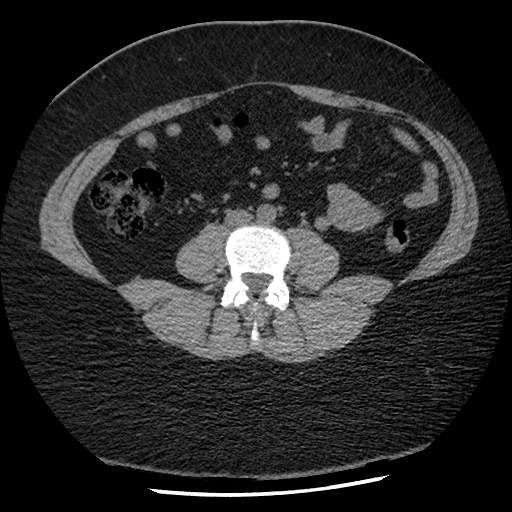
[im 106/173  soft-tissue]
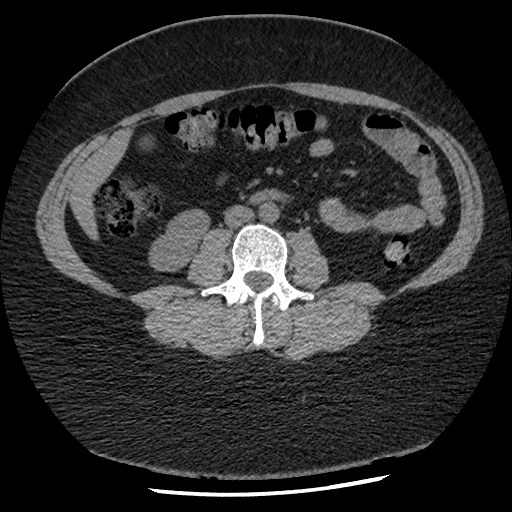
[im 106/173  bone]
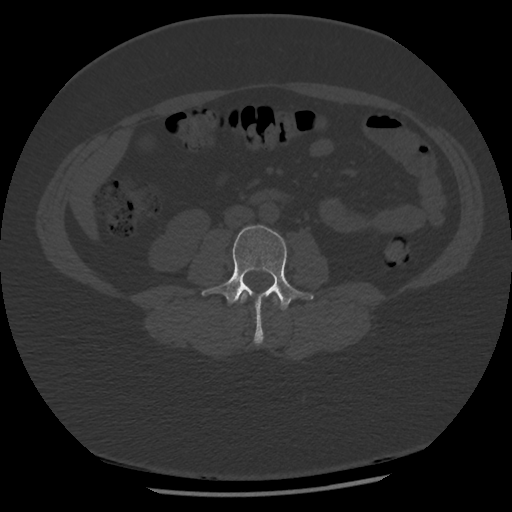
[im 117/173  soft-tissue]
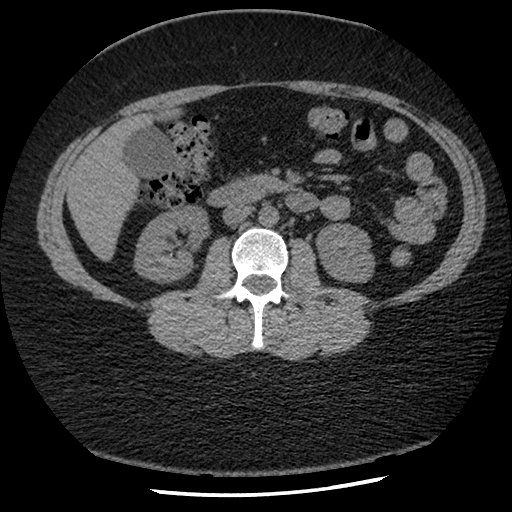
[im 128/173  soft-tissue]
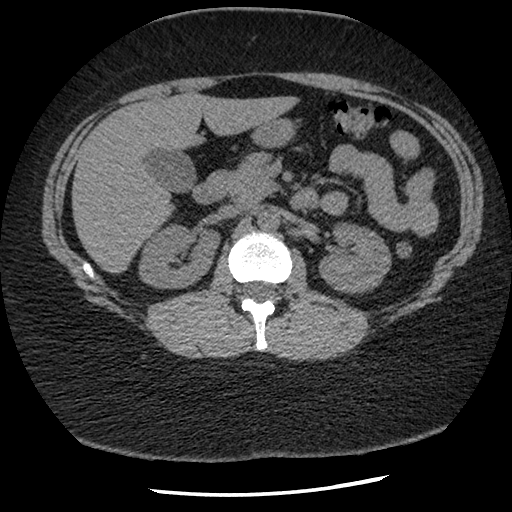
[im 139/173  soft-tissue]
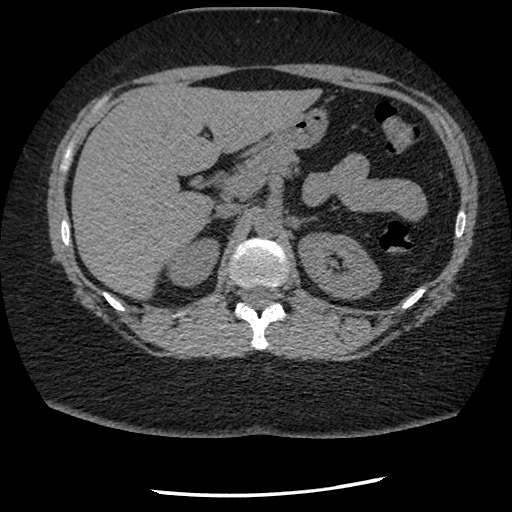
[im 150/173  soft-tissue]
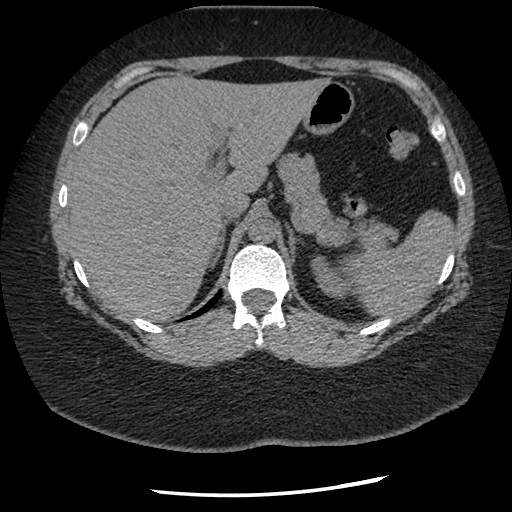
[im 161/173  soft-tissue]
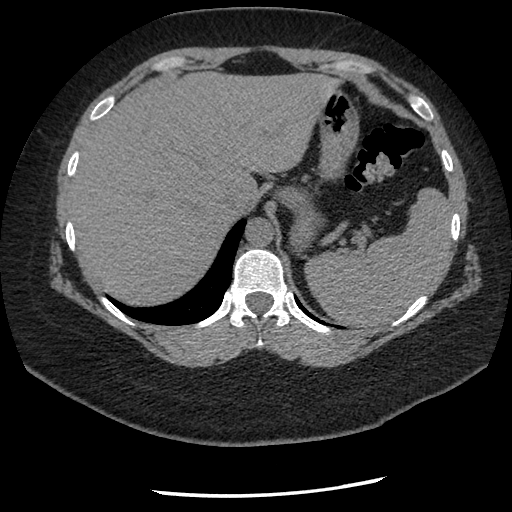

[Series 602: sag standard 2x2 · sagittal · 0.84mm/px · 3 of 181 slices shown]
[im 61/181  soft-tissue]
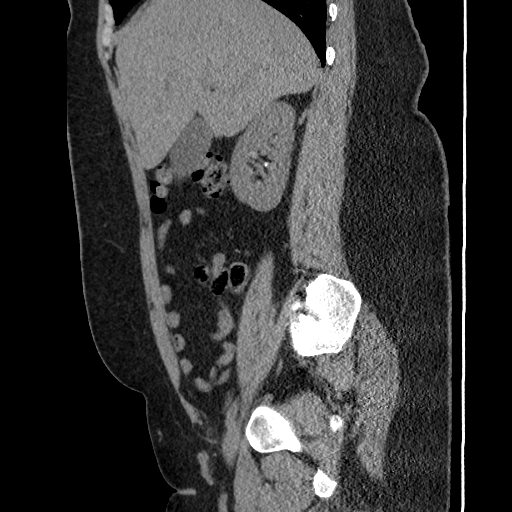
[im 81/181  soft-tissue]
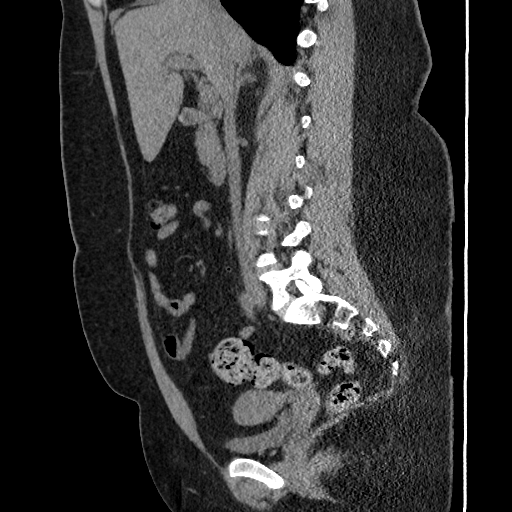
[im 101/181  soft-tissue]
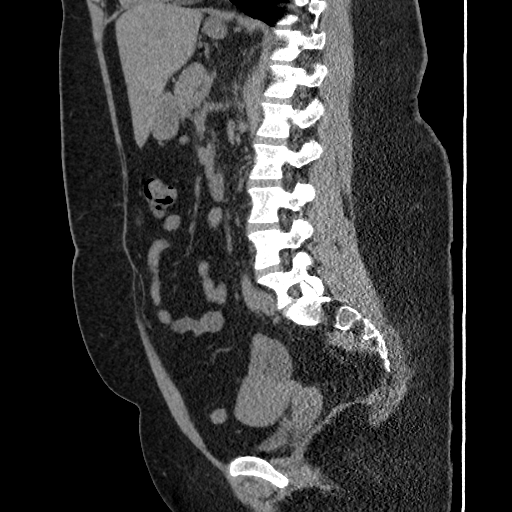

[17 of 46 positions shown; findings below may reference images not displayed]

FINDINGS: Lower lung fields and pleural spaces are clear. Sagittal reformatted images demonstrates normal alignment of the osseous structures without bony lytic or destructive change.
CT ABDOMEN: The GE junction and stomach duodenal bulb and sweep is unremarkable. Liver gallbladder spleen and pancreas appear unremarkable. The adrenal glands are normal.
The right kidney demonstrates multiple nonobstructing right renal stones largest 3 to 4 mm stable when compared to the prior exam. There is no hydronephrosis or hydroureter.
Left kidney demonstrates multiple sub-9 mm stone remaining mid to upper pole without hydronephrosis or perinephric stranding. Coarse of the left ureter unremarkable to level the bladder.
Retroperitoneum demonstrates no significant abnormality.
The intra-abdominal wall and soft tissues demonstrates a small fat-containing periumbilical hernia without inflammatory changes. No free air or fluid in the abdominal cavity.
CT PELVIS: Bladder is unremarkable. Uterus anteverted. Region of the adnexa demonstrates fullness of the left adnexa with a cystic lesion Hounsfield units measuring approximately 15 probably representing a simple left adnexal cyst 2.8 cm without inflammatory changes. There is no evidence of free fluid in the cul-de-sac.
Inguinal regions normal. Bowel pattern distribution unremarkable. Appendix identified felt to be normal.
IMPRESSION: 
IMPRESSION: Bilateral nonobstructing renal calculi.
Probable dominant left adnexal cyst described above.
Location: 1
Is the patient pregnant?
No

## 2018-11-22 IMAGING — CR XR CHEST 1 VIEW
1 series · 1 of 1 positions shown · non-contrast
Comparison: none

SINGLE PORTABLE CHEST:
CLINICAL INDICATION: chest pain
REFERENCE: None.

[AP]
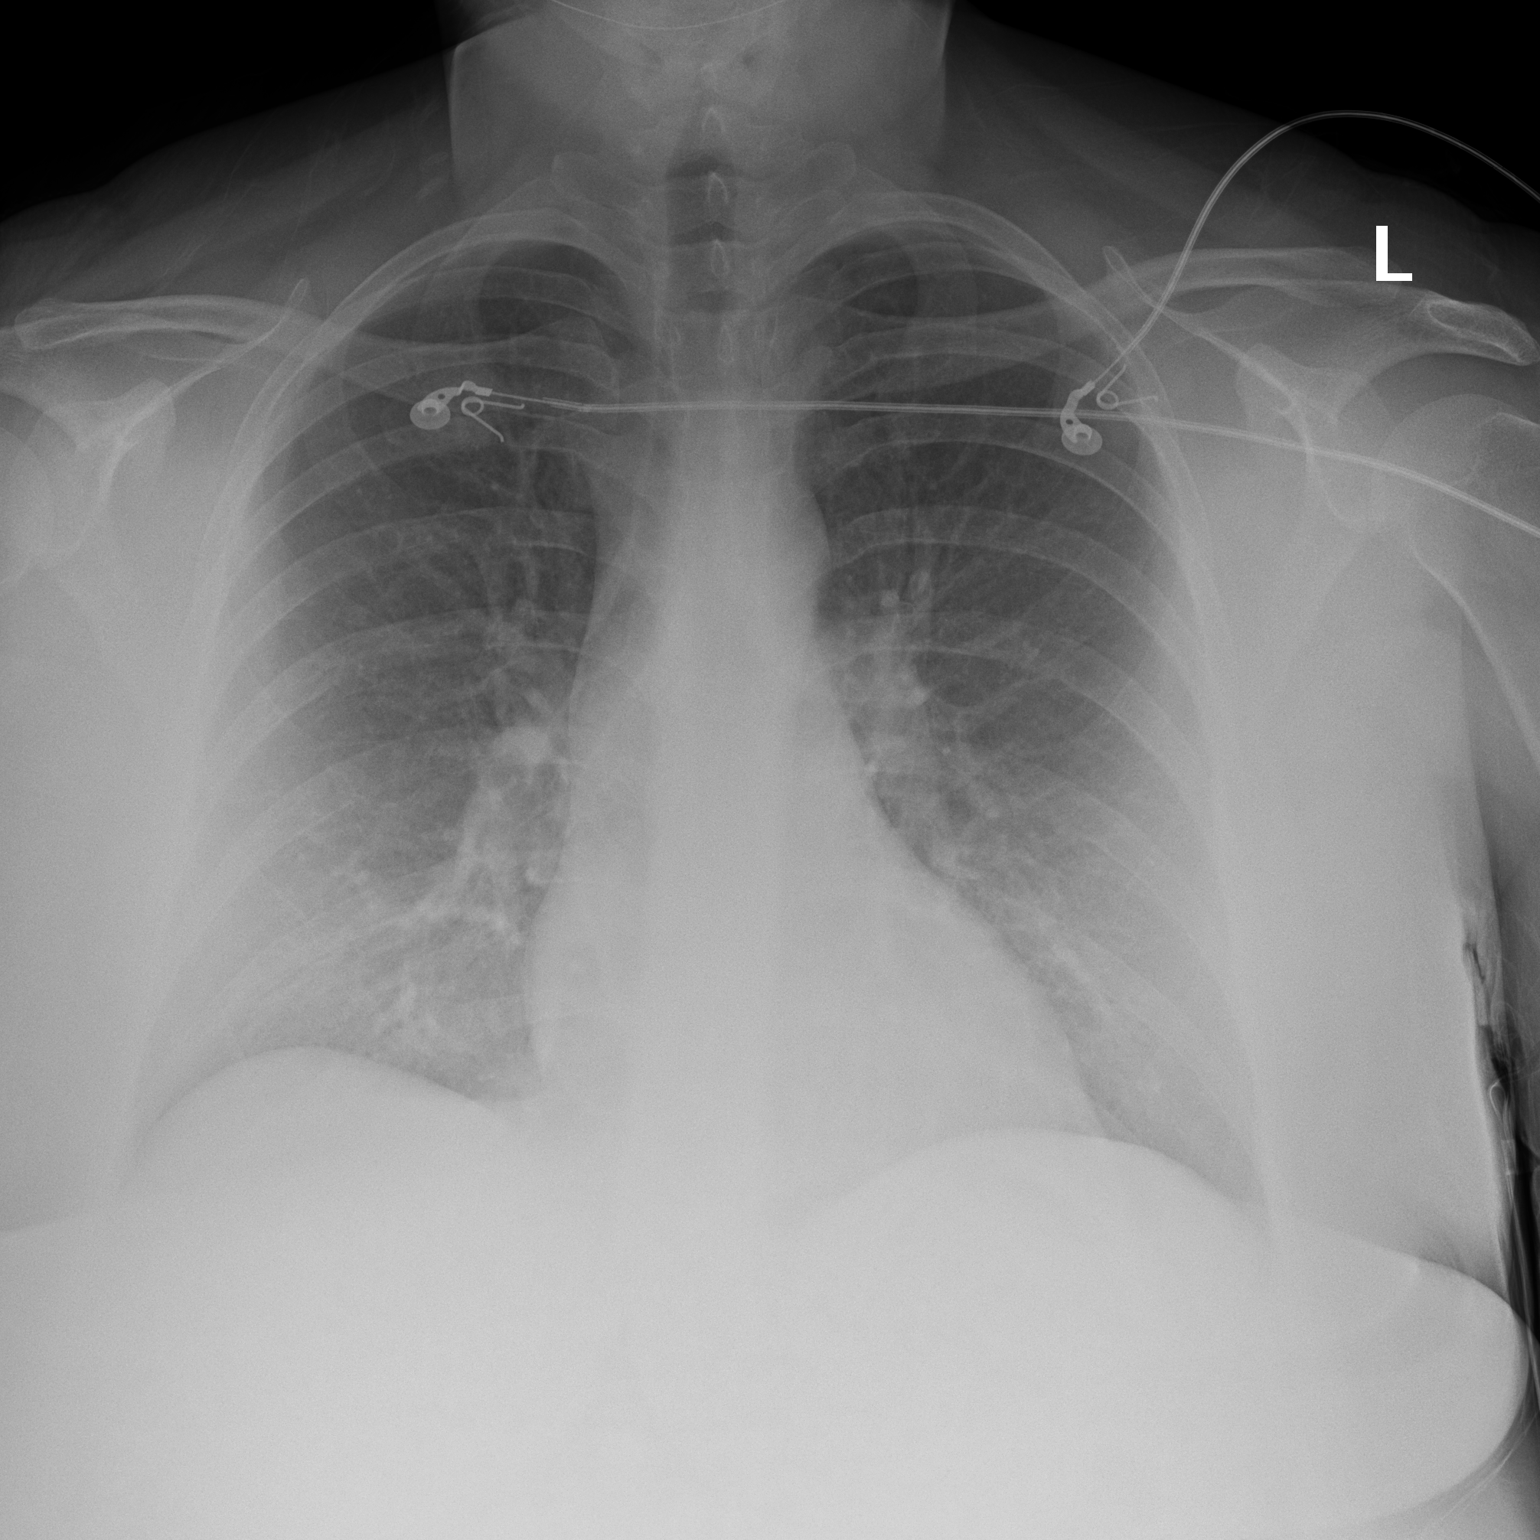

[1 of 1 positions shown; findings below may reference images not displayed]

FINDINGS: Single radiograph of the chest demonstrates normal cardiomediastinal contours.  Obesity and underpenetration limit evaluation.
The lungs demonstrate no mass, infiltrate, or effusion.
Surrounding osseous and soft tissue structures demonstrate no acute abnormality.
IMPRESSION: Limited exam with no acute cardiopulmonary process identified.
LOCATION CODE: 1
Is the patient pregnant?
No

## 2019-02-10 IMAGING — CT CT ABDOMEN PELVIS W CONTRAST
2 of 4 series · 17 of 46 positions shown, 19 images · non-contrast
Comparison: CT scan 08/30/2018

CT ABDOMEN PELVIS W CONTRAST
INDICATION: Lower abdominal pain
TECHNIQUE: Following informed consent IV contrast routine images obtained

[Series 2: abd/pel · axial · 0.94mm/px · z∈[-478,-25]mm · 14 of 201 slices shown, 16 images]
[im 10/201  soft-tissue]
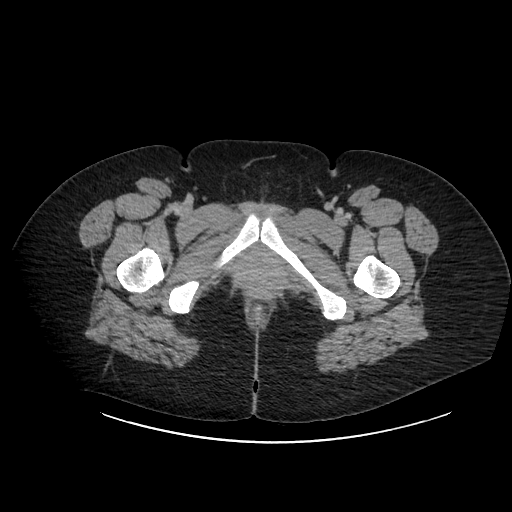
[im 10/201  bone]
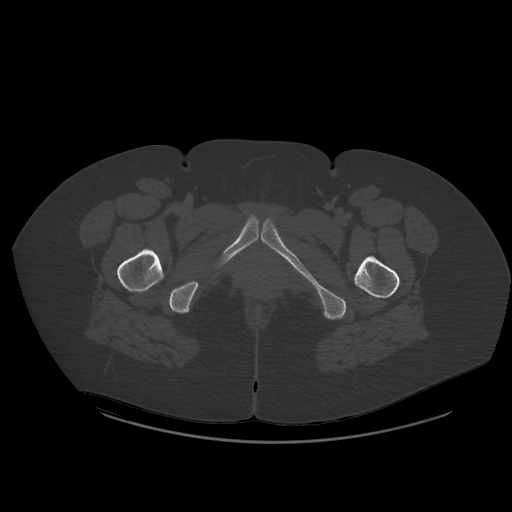
[im 29/201  soft-tissue]
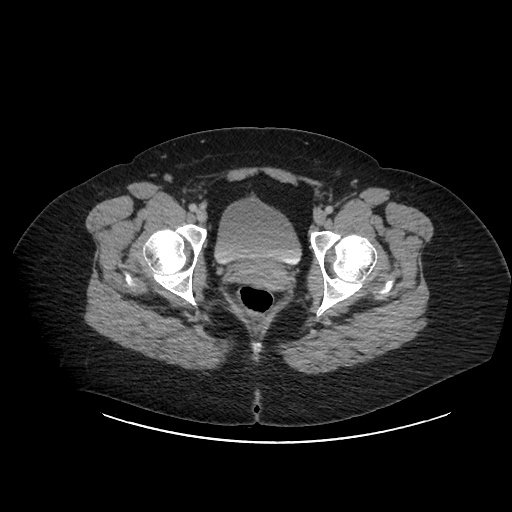
[im 39/201  soft-tissue]
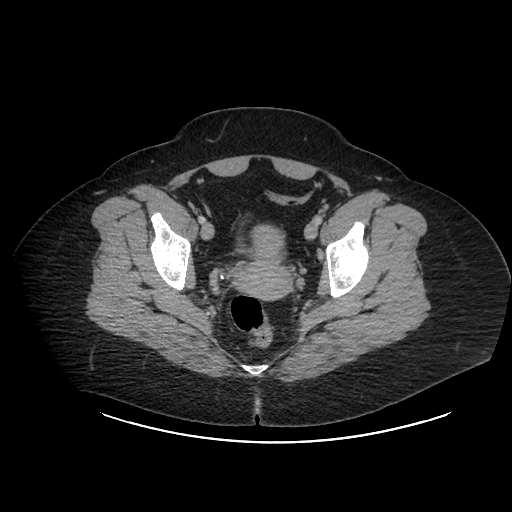
[im 58/201  soft-tissue]
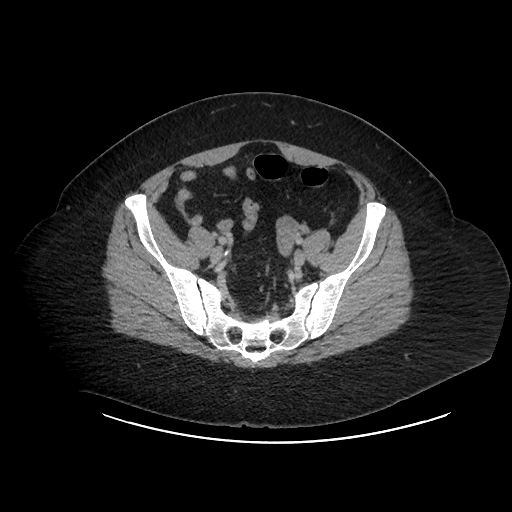
[im 67/201  soft-tissue]
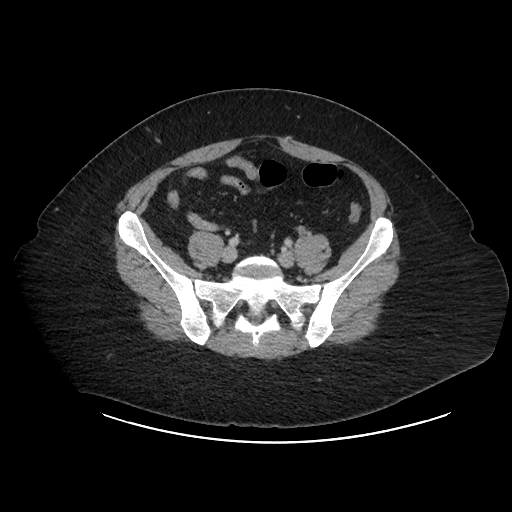
[im 77/201  soft-tissue]
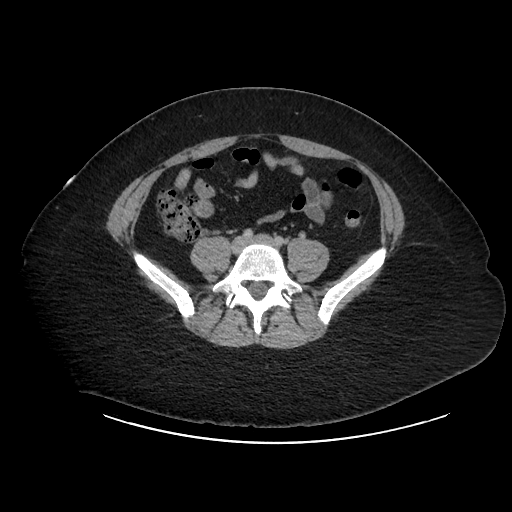
[im 96/201  soft-tissue]
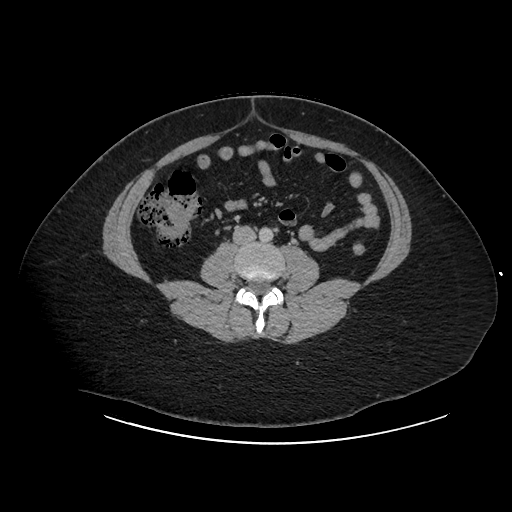
[im 105/201  soft-tissue]
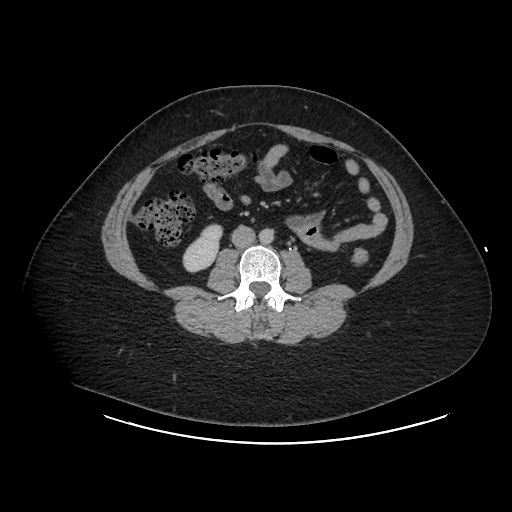
[im 124/201  soft-tissue]
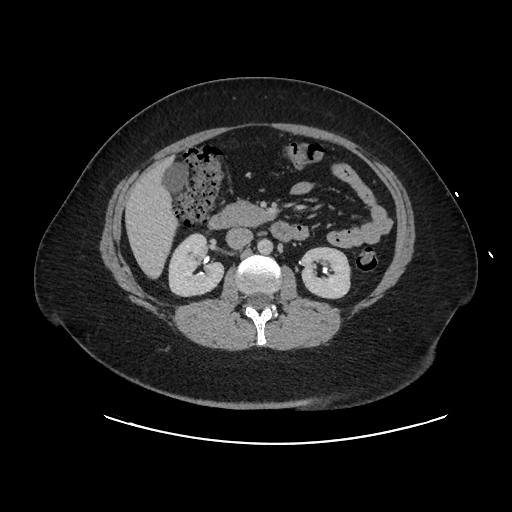
[im 124/201  bone]
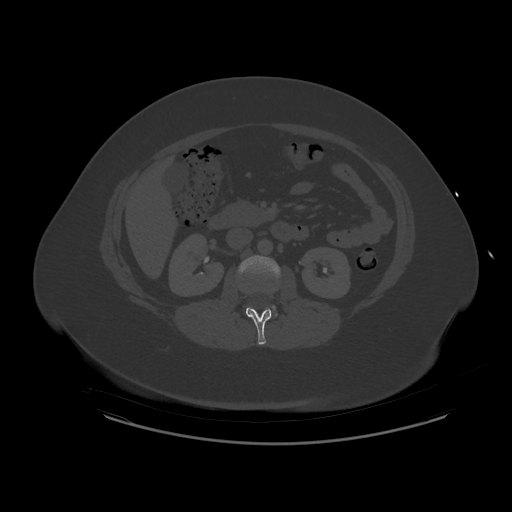
[im 134/201  soft-tissue]
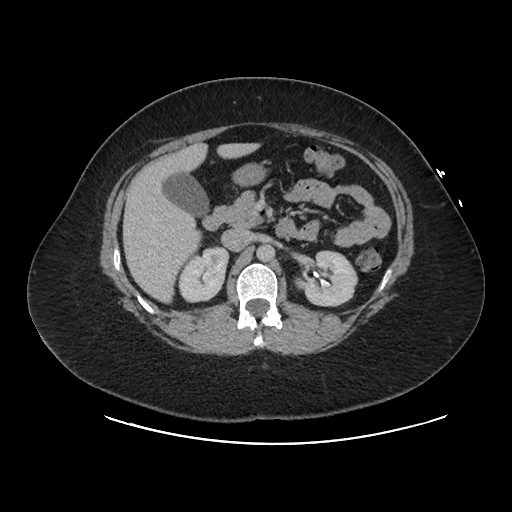
[im 153/201  soft-tissue]
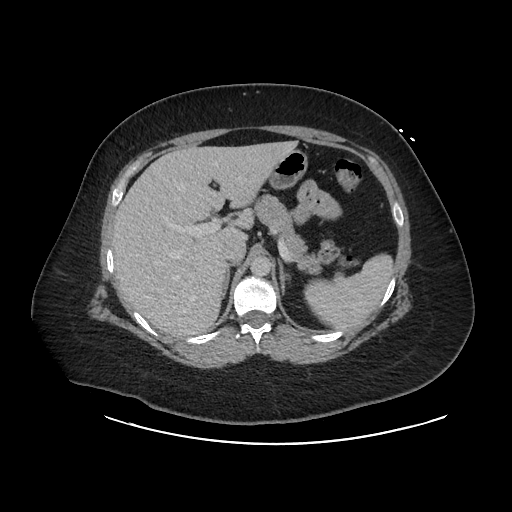
[im 162/201  soft-tissue]
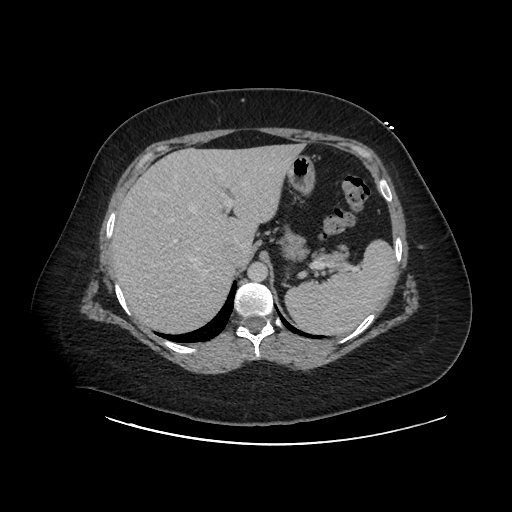
[im 172/201  soft-tissue]
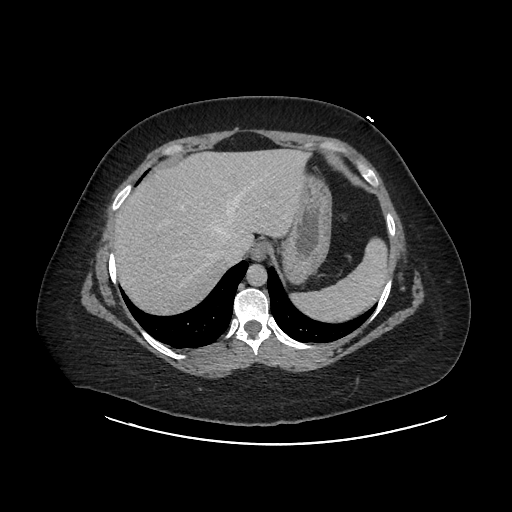
[im 191/201  soft-tissue]
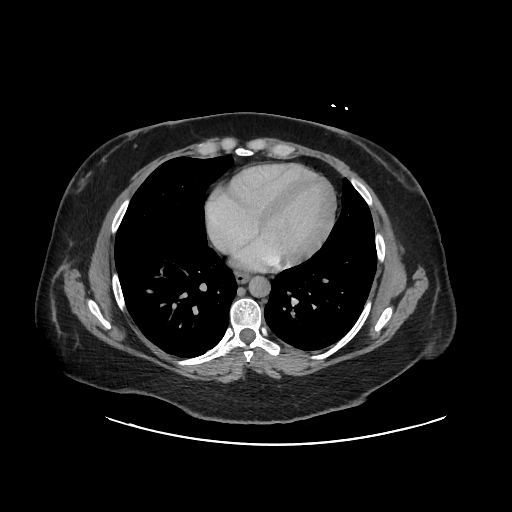

[Series 602: sag standard 2x2 · sagittal · 0.98mm/px · 3 of 234 slices shown]
[im 78/234  soft-tissue]
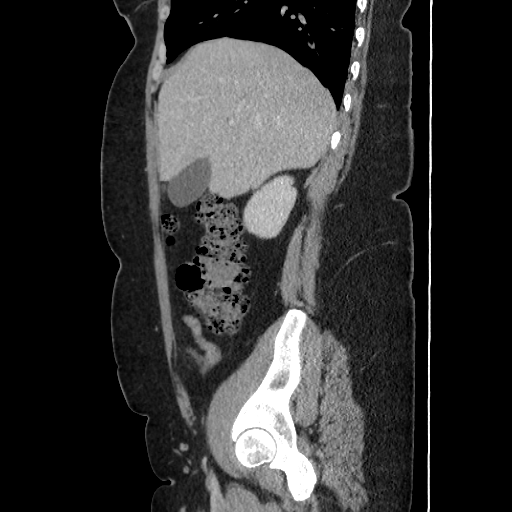
[im 104/234  soft-tissue]
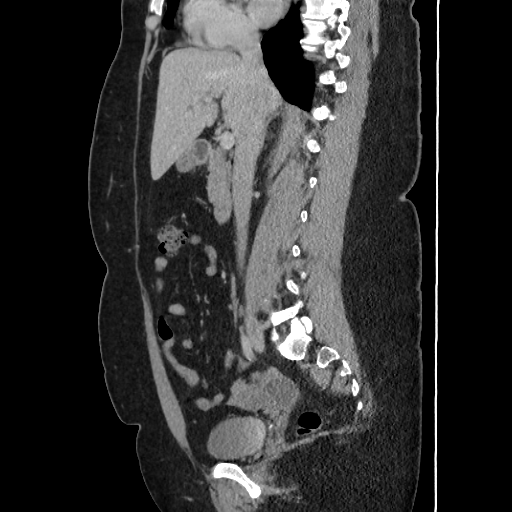
[im 130/234  soft-tissue]
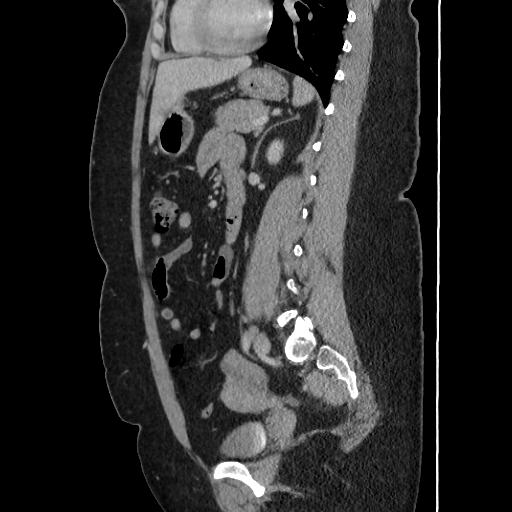

[17 of 46 positions shown; findings below may reference images not displayed]

FINDINGS: Lower lung fields and pleural spaces clear. Sagittal reformatted images demonstrates normal alignment of the osseous structures without acute abnormality.
CT ABDOMEN: Cardiac chambers pericardium unremarkable. GE junction and stomach duodenal bulb and sweep unremarkable. Liver gallbladder spleen and pancreas demonstrates borderline hepatomegaly with the liver measuring 19.8 cm right lobe. Adrenal glands and kidneys demonstrates no significant abnormality with normal renal excretion. Retroperitoneum is normal. No free air or fluid in the abdominal cavity.
CT PELVIS: Bladder is unremarkable. Uterus anteverted. Dominant right physiological cyst right ovary 3.2 cm. No free fluid in the cul-de-sac. Small right and left subcentimeter reactive inguinal lymph nodes.
The bowel pattern distribution is unremarkable.
IMPRESSION: 
IMPRESSION: Right adnexal physiological cysts. No other significant intra-abdominal abnormality.
Location: 1
Is the patient pregnant?
No

## 2019-03-10 IMAGING — US US PELVIS TRANSVAGINAL
1 series · 14 of 25 positions shown · non-contrast
Comparison: none

Exam:Transvaginal ultrasound and limited pelvic ultrasound.
REASON FOR EXAM: Pelvic pain.

[Series 1: us pelvis transvaginal · 14 of 52 slices shown]
[im 1/52]
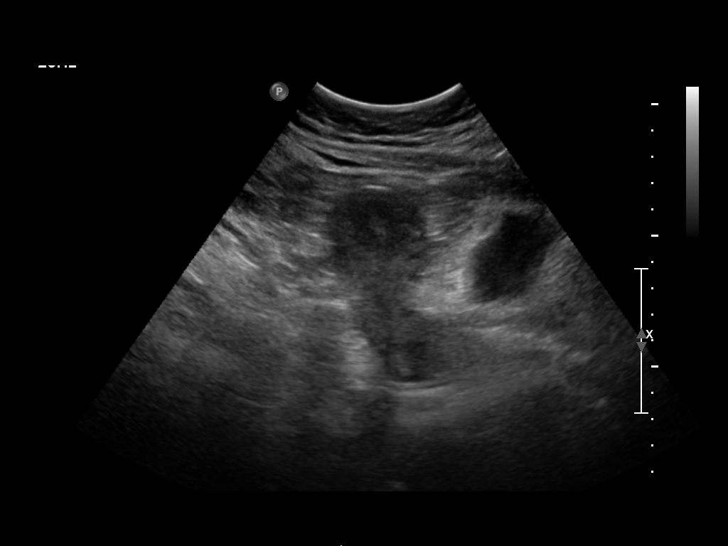
[im 5/52]
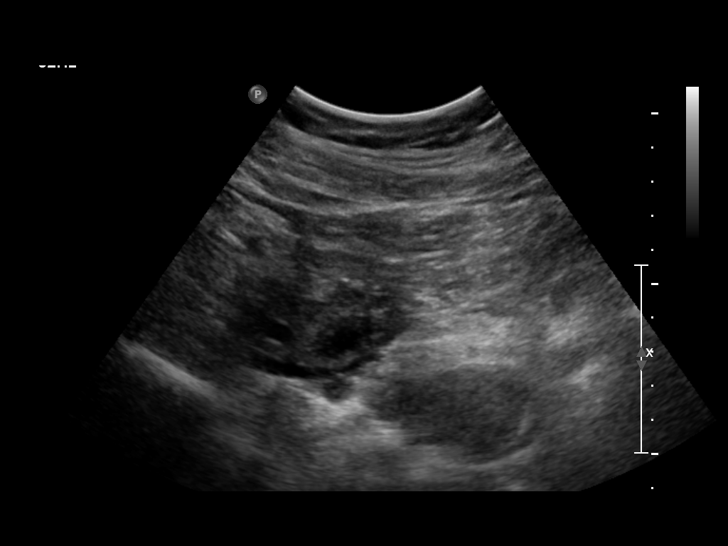
[im 9/52]
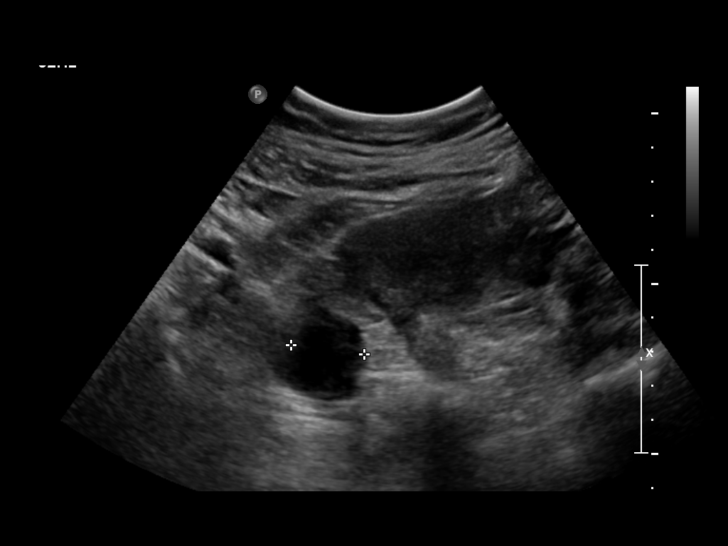
[im 13/52]
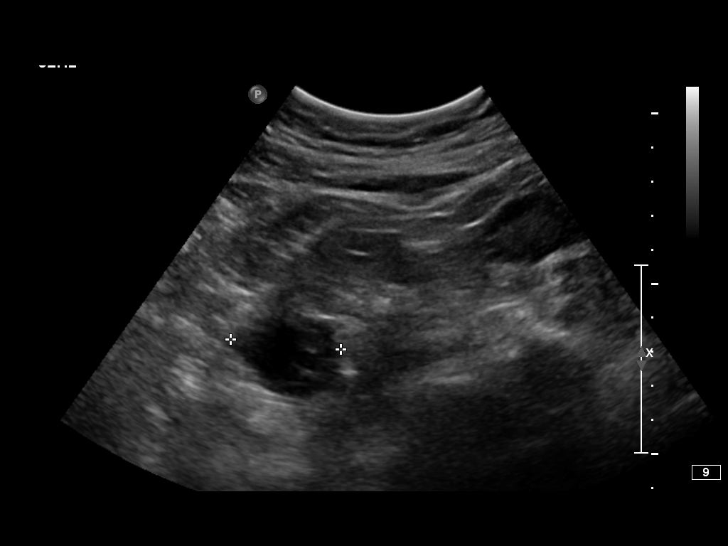
[im 18/52]
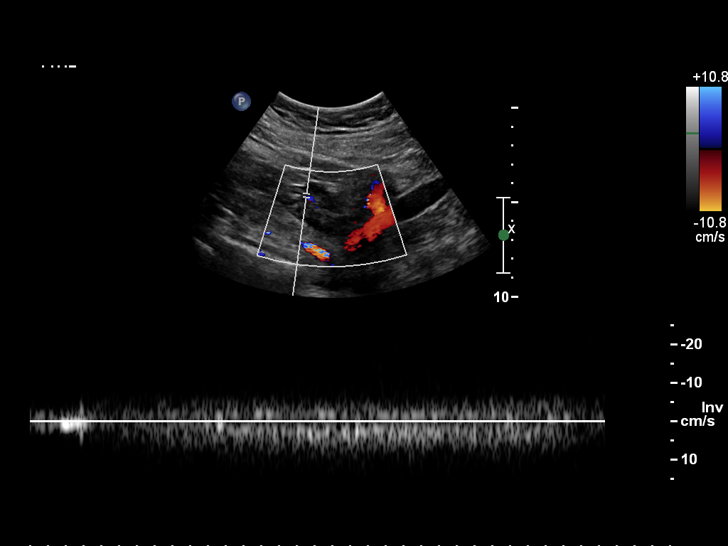
[im 20/52]
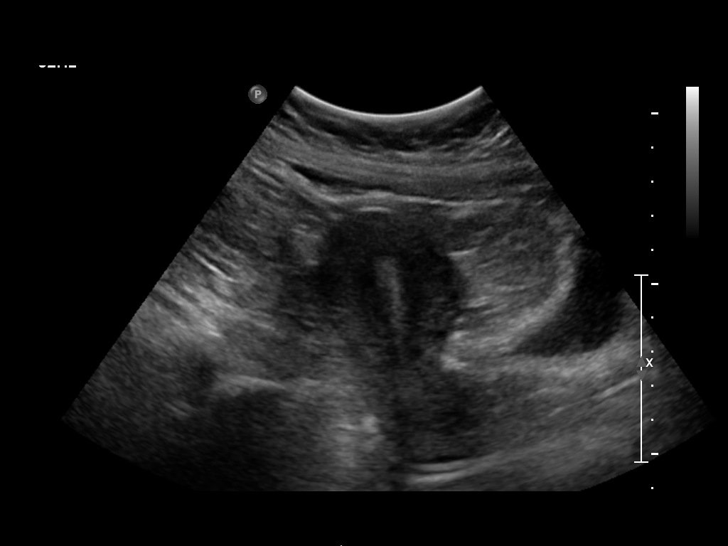
[im 24/52]
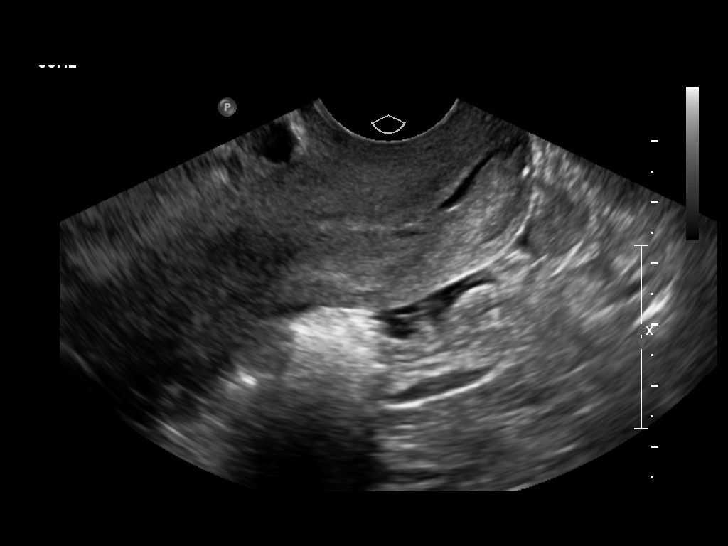
[im 28/52]
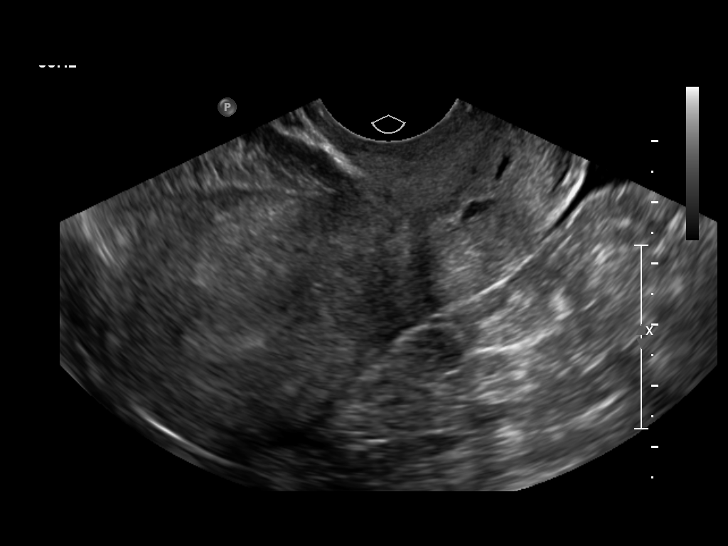
[im 32/52]
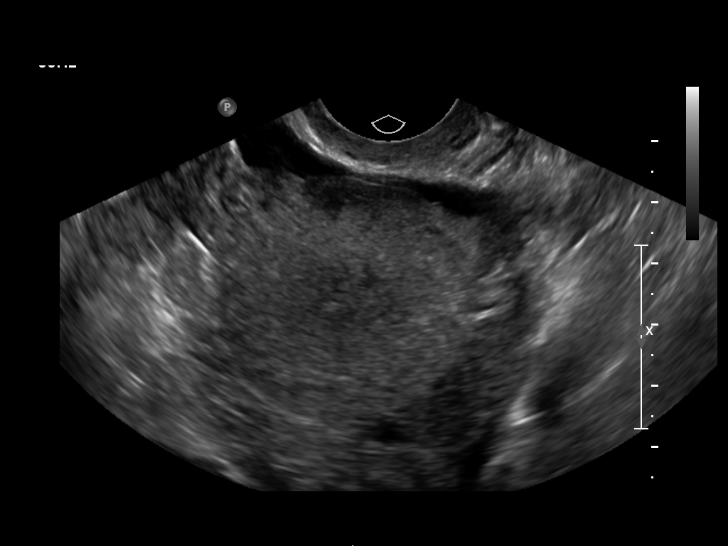
[im 35/52]
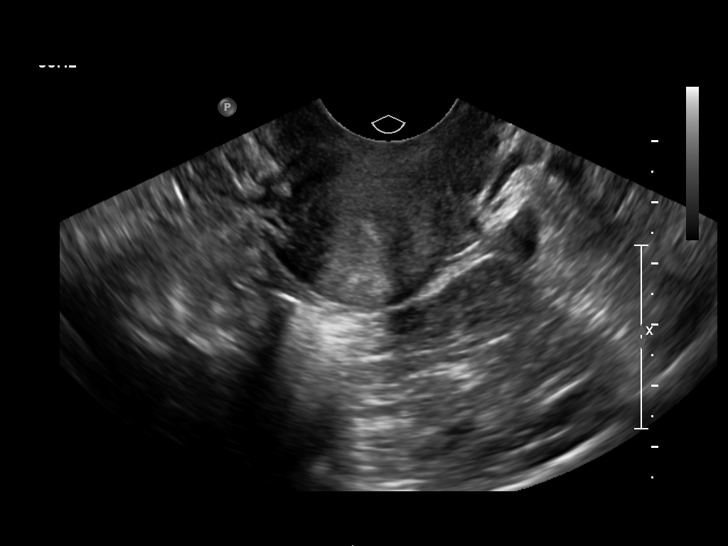
[im 39/52]
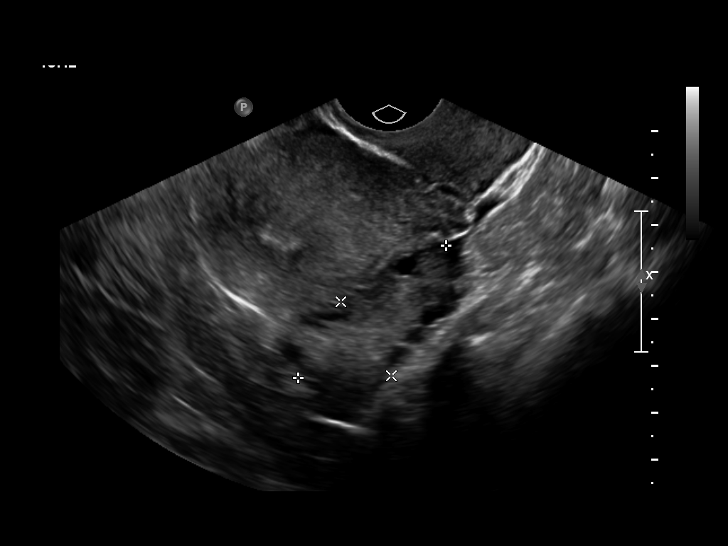
[im 43/52]
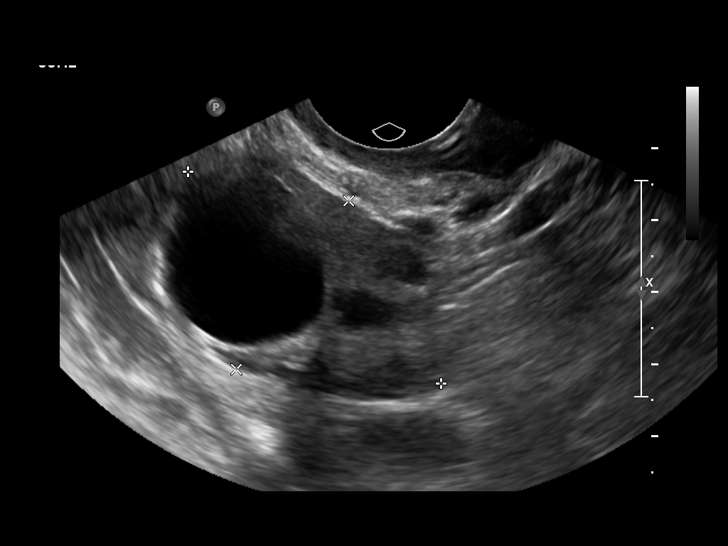
[im 47/52]
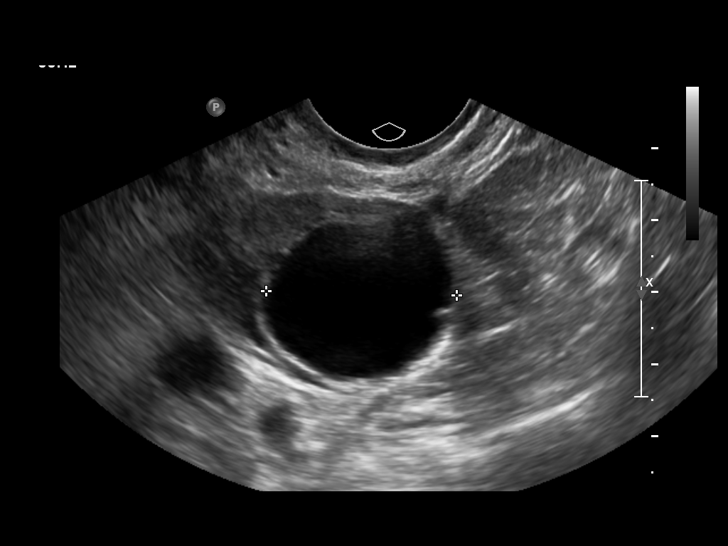
[im 52/52]
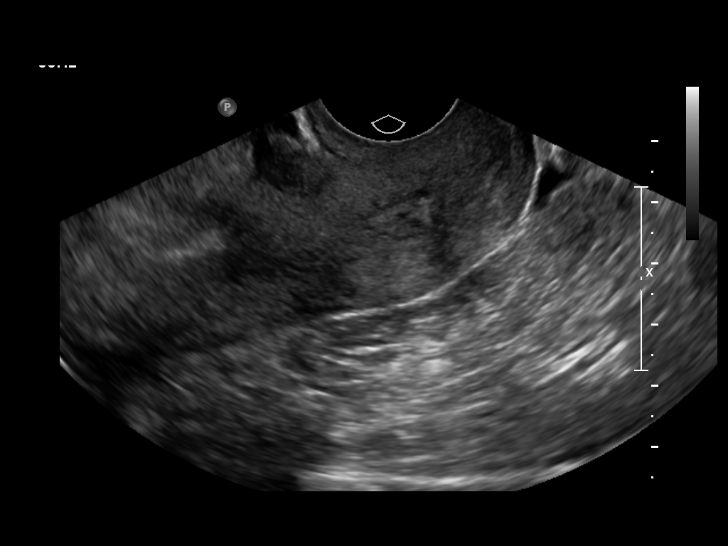

[14 of 25 positions shown; findings below may reference images not displayed]

FINDINGS: The uterus measures 7.9 cm craniocaudal by 3.9 cm AP by 5.9 cm transverse. Endometrial stripe is normal measuring 9 mm in AP thickness.
The right ovary measures 4.6 x 2.9 x 3.6 cm and contains a 2.7 cm follicle. The left ovary measures 4.3 x 2.1 x 3 cm and is unremarkable. Color flow and Doppler are normal bilaterally. No adnexal masses seen. No free fluid is present.
IMPRESSION: Unremarkable exam
Location:1

## 2019-04-25 IMAGING — CT CT HEAD WO CONTRAST
2 series · 16 of 30 positions shown, 20 images · non-contrast
Comparison: none

CT head without contrast
INDICATION: syncope, fall

[Series 4: brain without · axial · non-contrast · 0.49mm/px · z∈[+95,+232]mm · 13 of 36 slices shown, 17 images]
[im 3/36  brain]
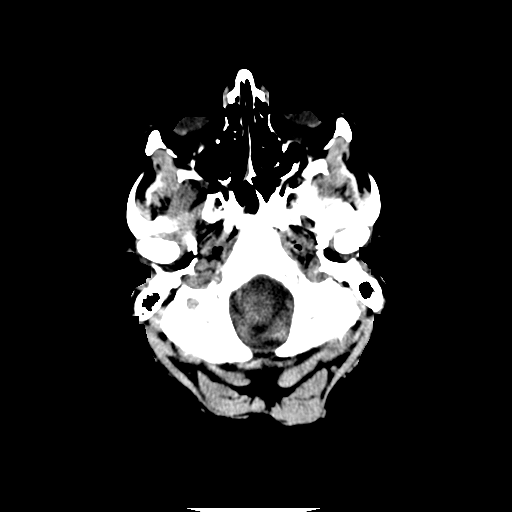
[im 3/36  bone]
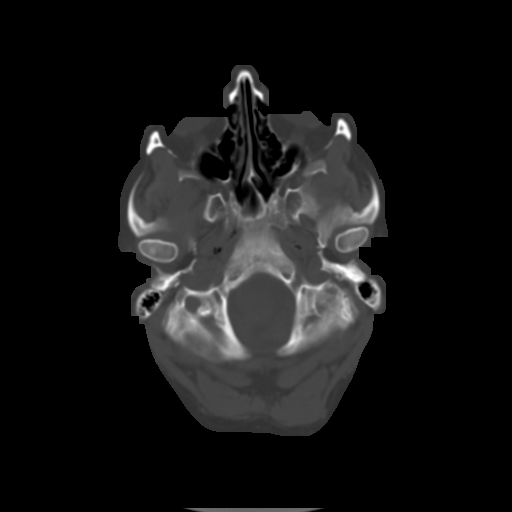
[im 6/36  brain]
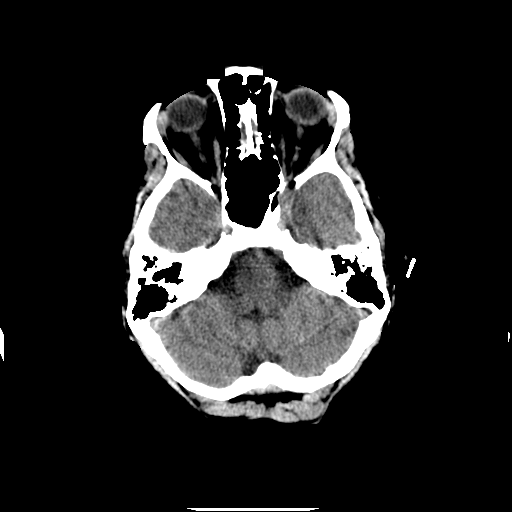
[im 8/36  brain]
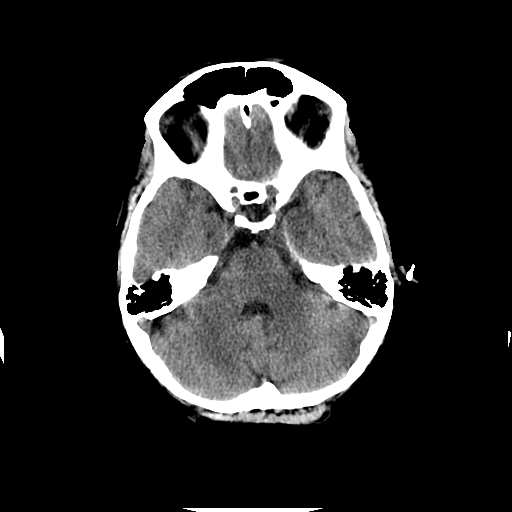
[im 11/36  brain]
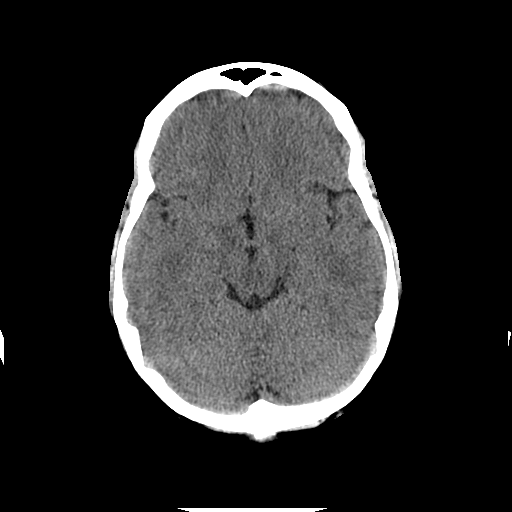
[im 13/36  brain]
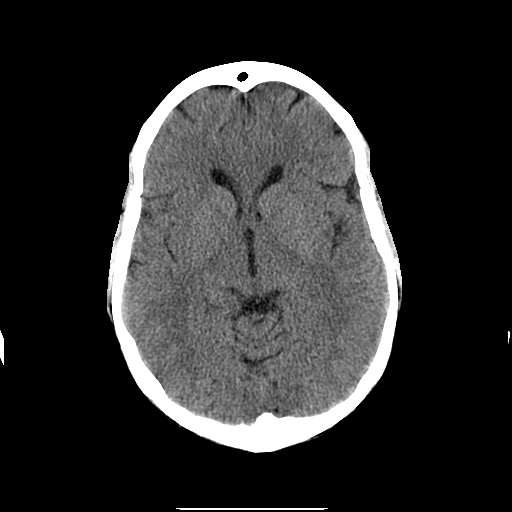
[im 13/36  bone]
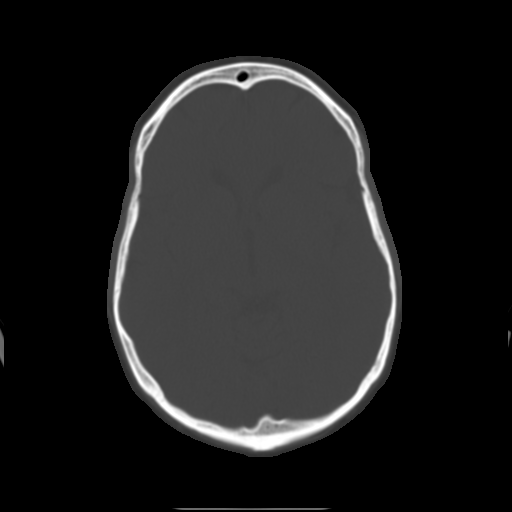
[im 16/36  brain]
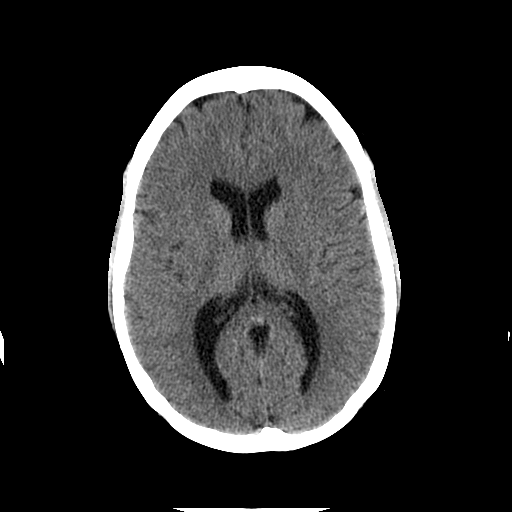
[im 18/36  brain]
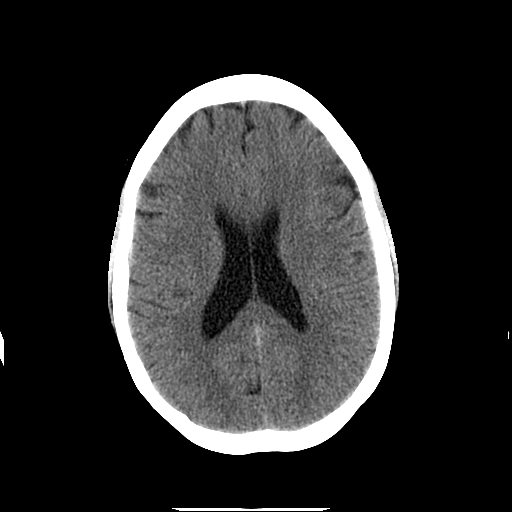
[im 21/36  brain]
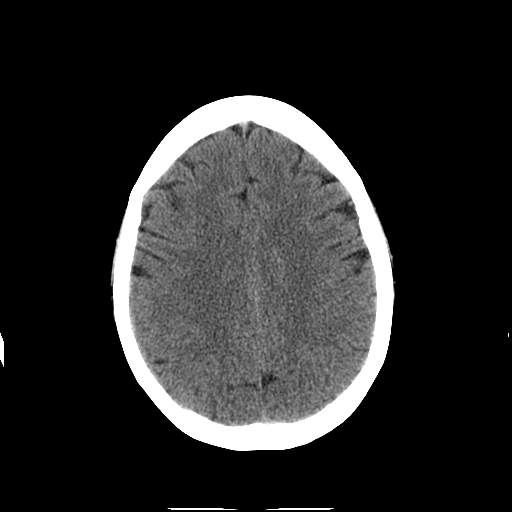
[im 23/36  brain]
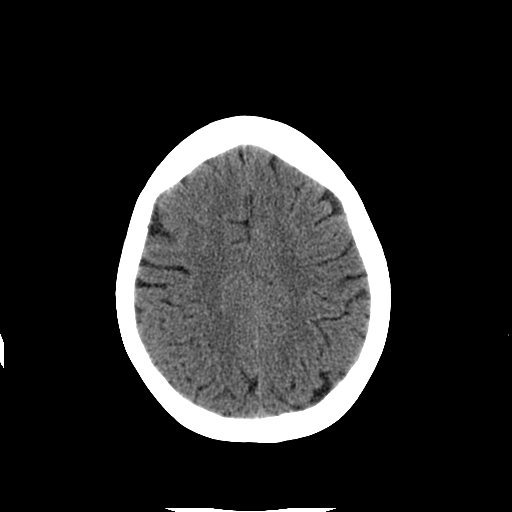
[im 23/36  bone]
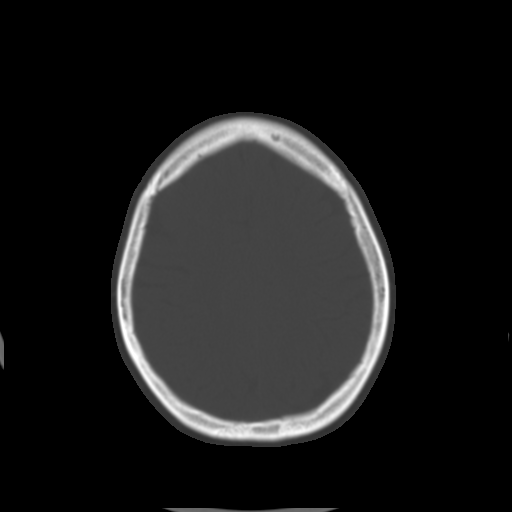
[im 26/36  brain]
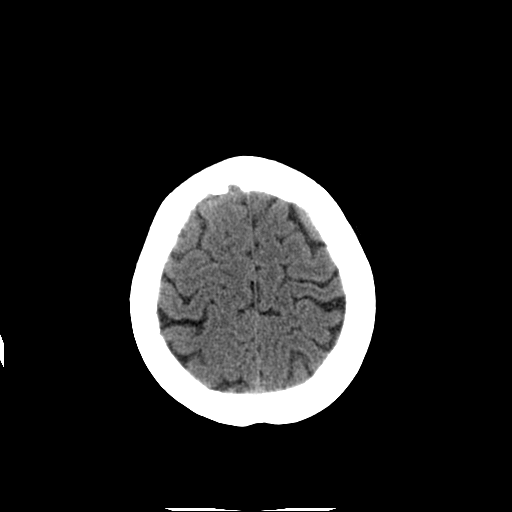
[im 28/36  brain]
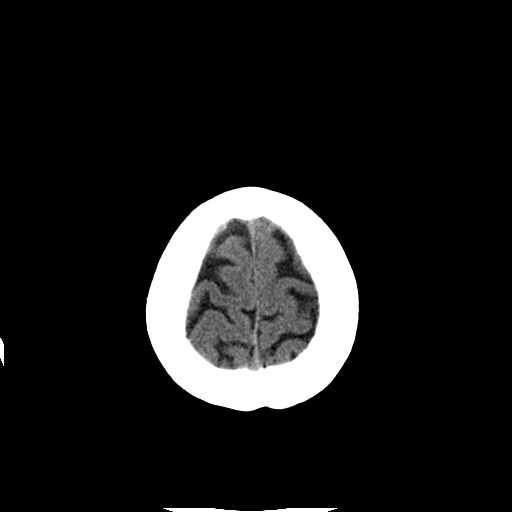
[im 31/36  brain]
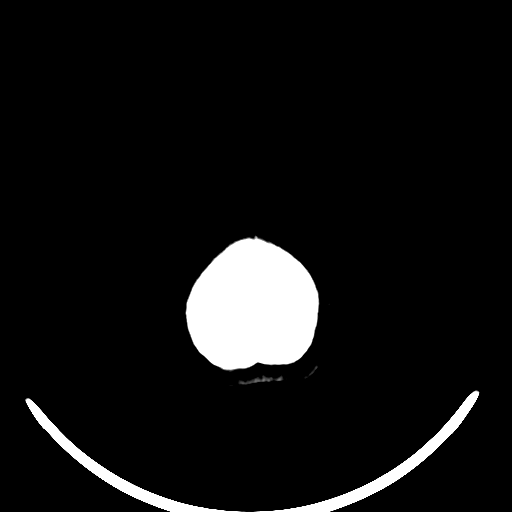
[im 33/36  brain]
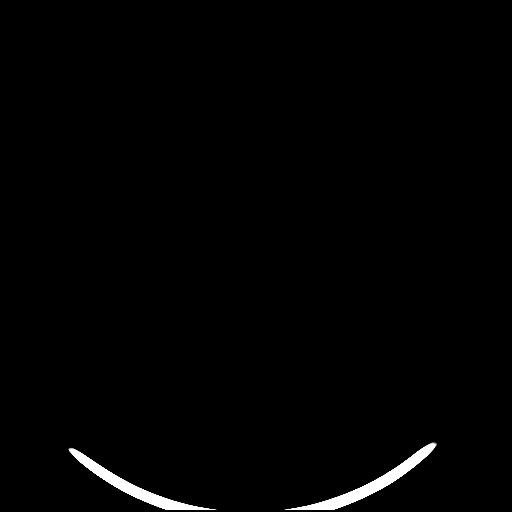
[im 33/36  bone]
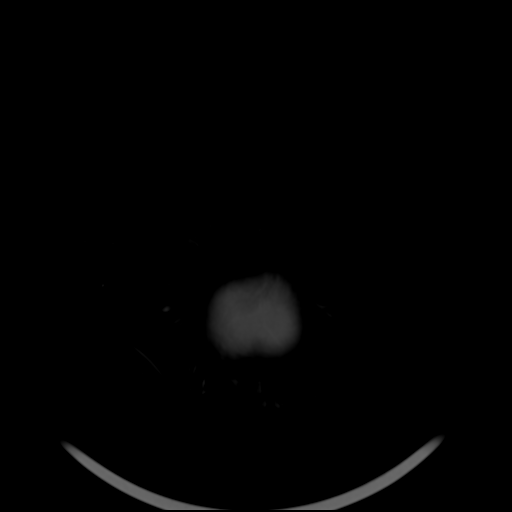

[Series 5: bone · axial · 0.49mm/px · z∈[+95,+141]mm · 3 of 36 slices shown]
[im 3/36  bone]
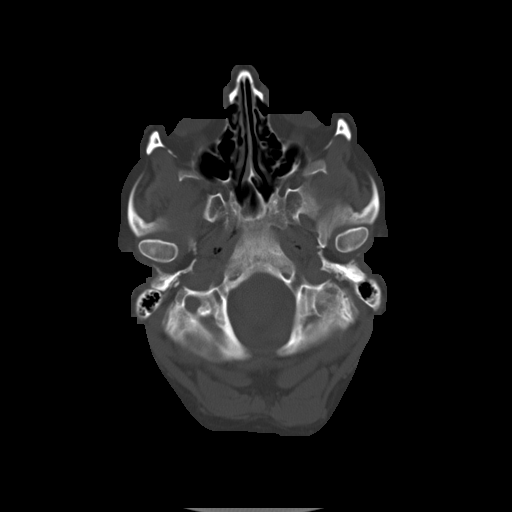
[im 8/36  bone]
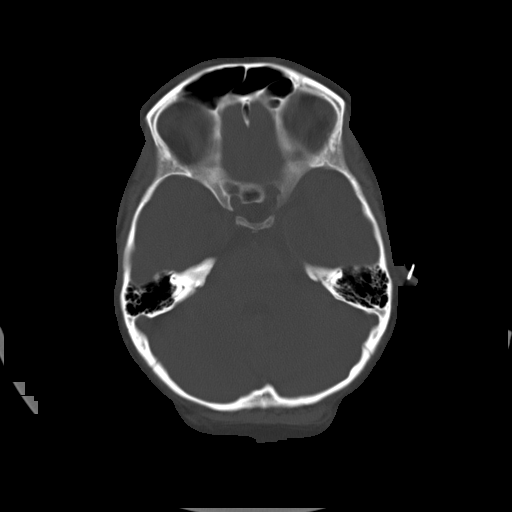
[im 13/36  bone]
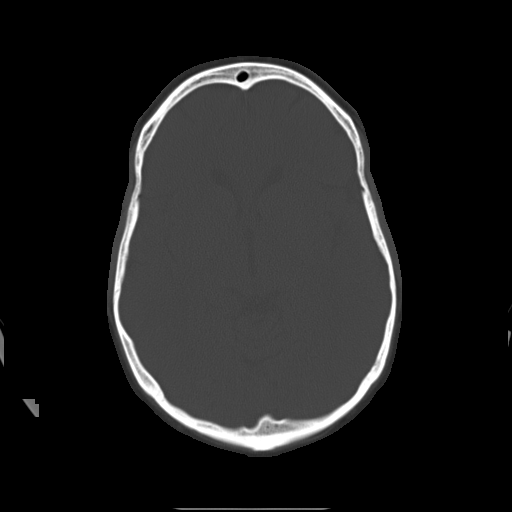

[16 of 30 positions shown; findings below may reference images not displayed]

FINDINGS: The bony calvarium and skull base are intact.  The visualized paranasal sinuses and mastoid air cells are clear.
No intracranial hemorrhage, mass-effect, or midline shift.  The ventricles, sulci, and cisterns are normal in size and configuration.  Normal gray white matter differentiation.  No abnormal extra-axial fluid collections.
IMPRESSION: 
IMPRESSION: No acute intracranial abnormality
Location 8
Is the patient pregnant?
Unknown

## 2019-04-28 IMAGING — MG MAMMO BREAST DIAGNOSTIC TOMOSYNTESIS BILATERAL
8 of 21 series · 8 of 40 positions shown · non-contrast
Comparison: none

This is a summary report. The complete report is available in the patient's medical record. If you cannot access the medical record, please contact the sending organization for a detailed fax or copy.
EXAM: DIAGNOSTIC BILATERAL MAMMOGRAM:
REFERENCE:  None-baseline
CLINICAL INDICATION:  unspecified lumps in the right and left breast. 2 lumps on the right, and 1 lump on the left. Bilateral reduction mammoplasty 2 years ago.
TISSUE TYPE: Scattered fibroglandular densities

[R ML]
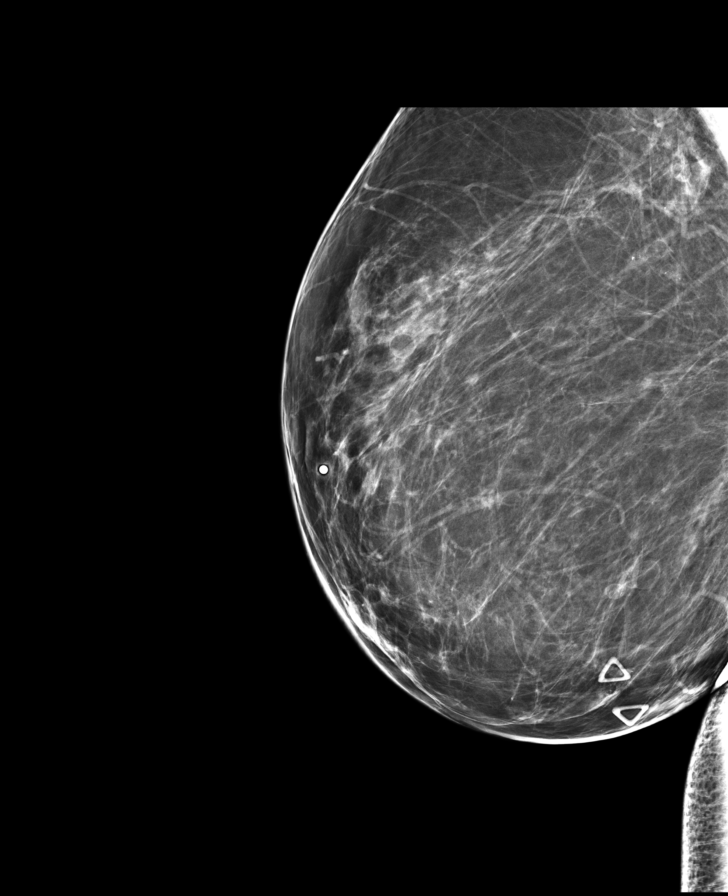

[L CC synth-2D (1 of 2)]
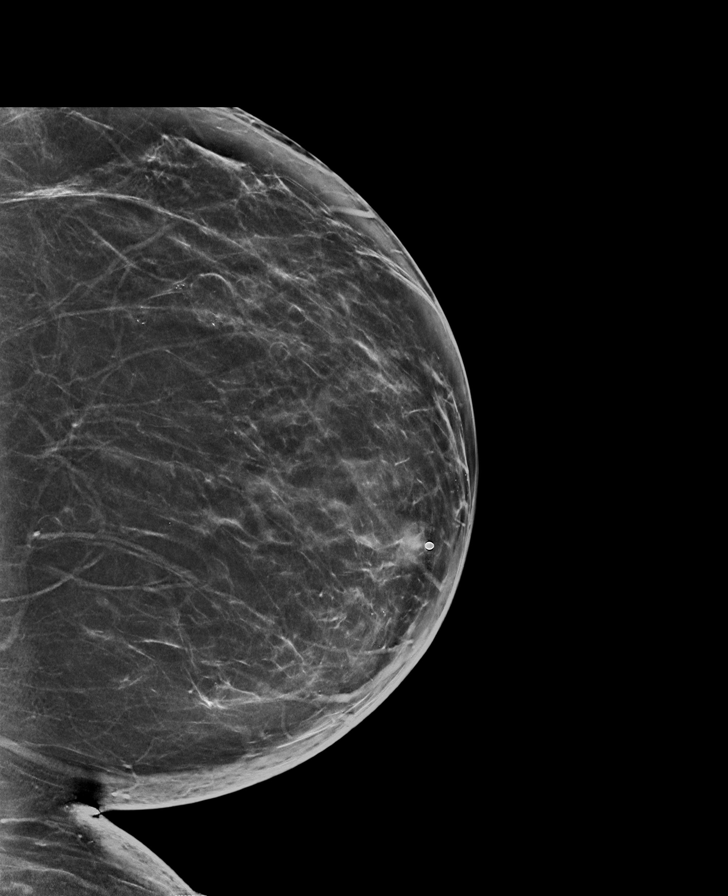

[R CC]
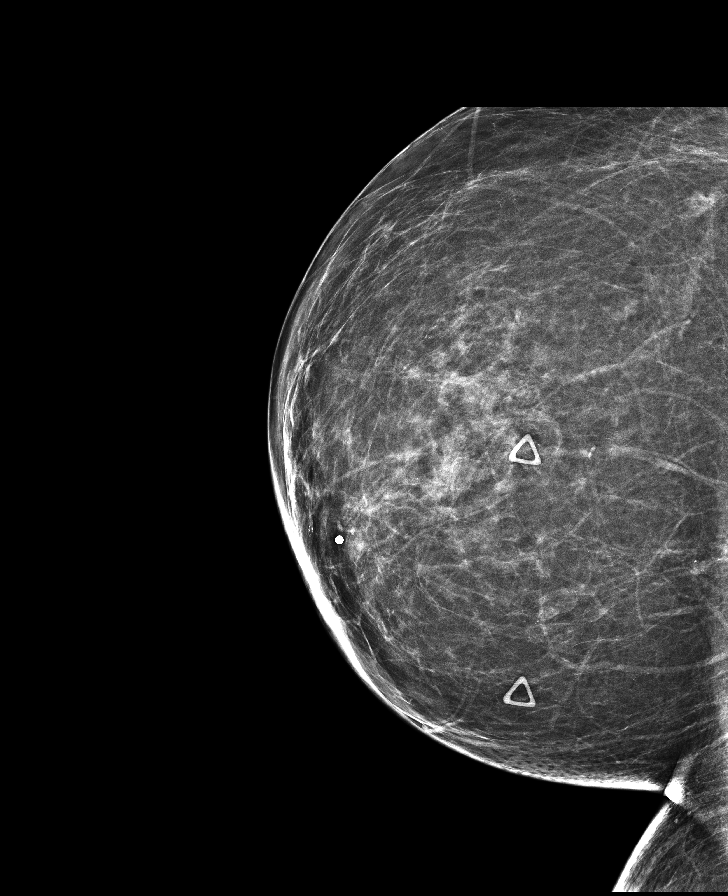

[L MLO]
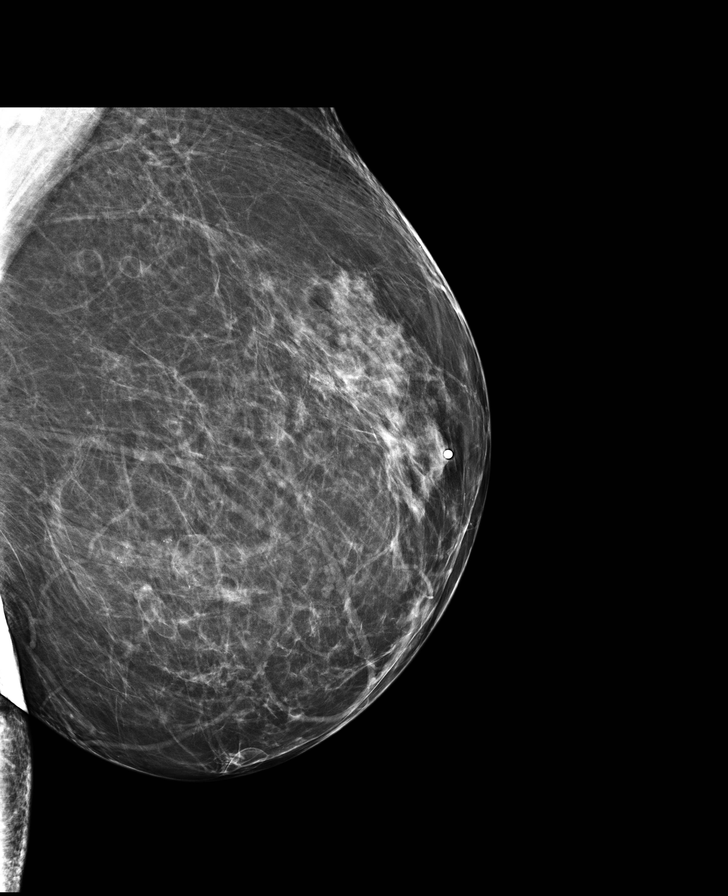

[R CC synth-2D]
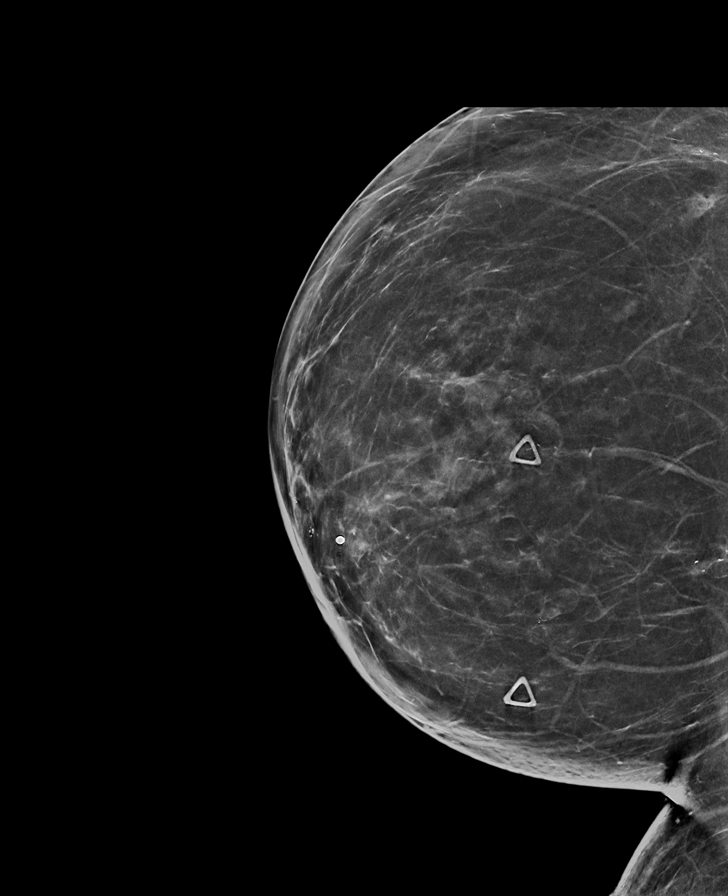

[R MLO]
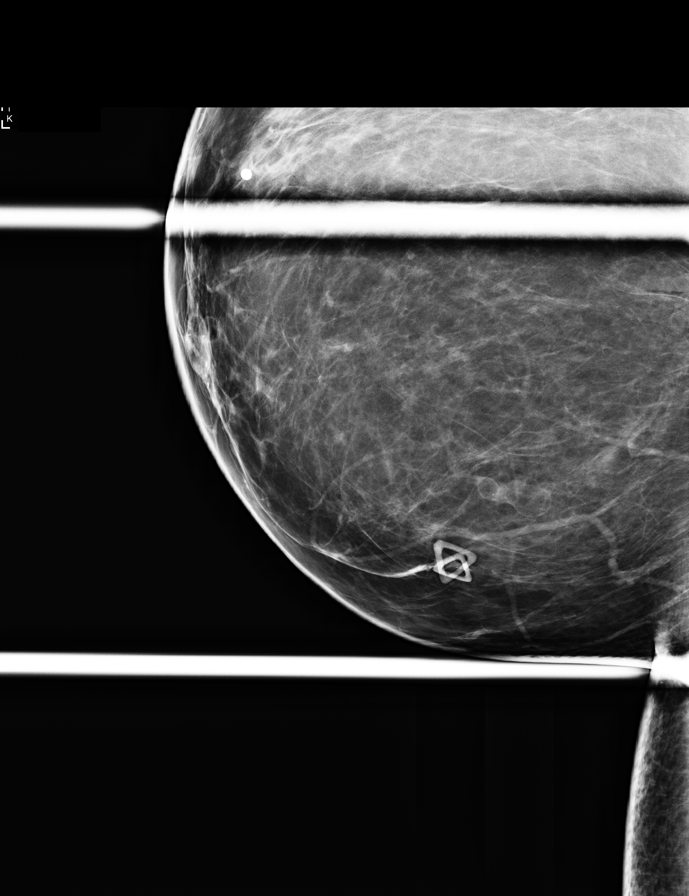

[L CC synth-2D (2 of 2)]
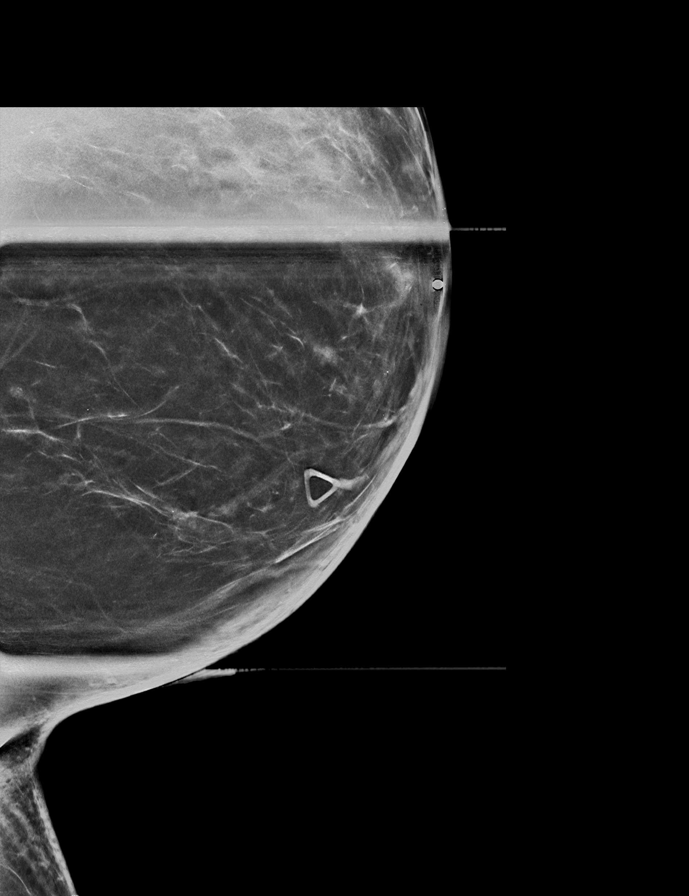

[L MLO synth-2D]
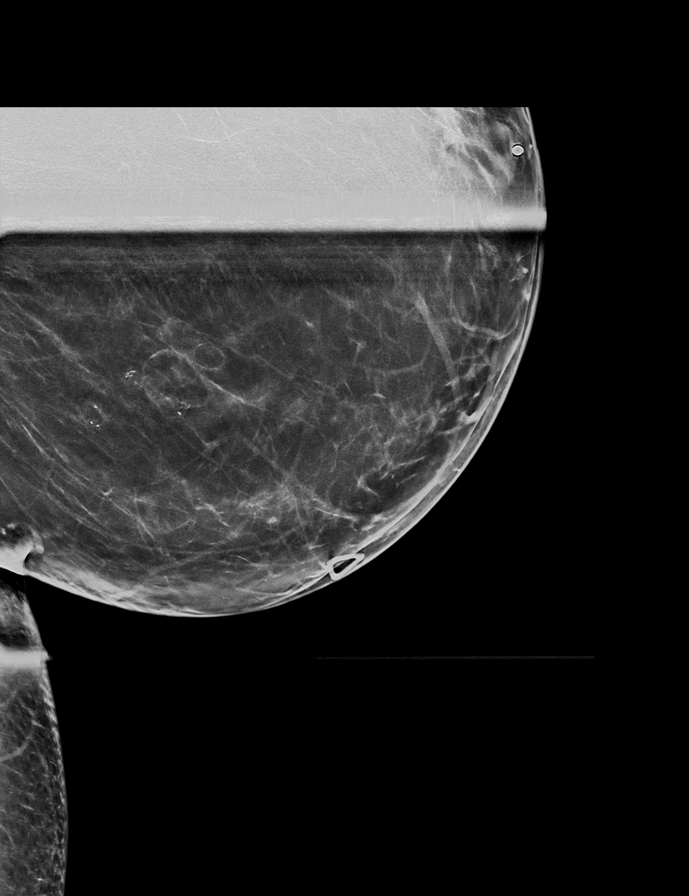

[8 of 40 positions shown; findings below may reference images not displayed]

FINDINGS: 2 areas of palpable concern were marked in the right breast from [DATE] to [DATE] in the middle one third depth. A single palpable area of concern was marked in the left breast in the left lower inner quadrant.
The patient is status post bilateral reduction mammoplasties with expected architectural changes. There are several oil cysts and calcifications typical of fat necrosis in both breasts, consistent with prior reduction mammoplasties.
There are no suspicious masses. There are no areas of architecture distortion or suspicious microcatheter calcification. There is no skin thickening or nipple retraction.
EXAM:  DIAGNOSTIC BILATERAL BREAST ULTRASOUND:
Right breast: Ultrasound of the right breast was performed from [DATE] to [DATE].
In the [DATE] position, 5 cm from the nipple, there is a simple cyst measuring 0.57 x 0.55 x 0.67 cm. In the [DATE] position, 5 cm from the nipple, there is an additional round simple cyst measuring 0.41 cm in diameter. There is an adjacent tiny complicated cyst measuring 0.28 x 0.25 x 0.33 cm.
In the [DATE] position, 3 cm from the nipple, there is a small simple cyst measuring 0.86 x 0.34 x 0.61 cm.
Left breast: Ultrasound of the left breast was performed from [DATE] to [DATE].
In the [DATE] position, 4 cm from the nipple there are 2 adjacent oil cysts, measuring 0.55 x 0.57 x 0.62 cm, and 0.58 x 0.55 x 0.55 cm.. There are no suspicious masses. There are no dilated ducts.
IMPRESSION: Bilateral reduction mammoplasties with areas of palpable concern corresponding with simple cysts and oil cysts associated with previous surgery. There is no mammographic or sonographic evidence of malignancy.
ACR BIRADS CATEGORY: 2: Benign
RECOMMENDATION: Recommend bilateral annual screening mammography unless otherwise clinically indicated.
Is the patient pregnant?
No

## 2019-07-17 IMAGING — CT CT HEAD WO CONTRAST
1 series · 16 of 30 positions shown, 20 images · non-contrast
Comparison: 04/25/2019

CT head without contrast
INDICATION: dizziness

[Series 2: head stnd · axial · 0.49mm/px · z∈[+12,+157]mm · 16 of 32 slices shown, 20 images]
[im 2/32  brain]
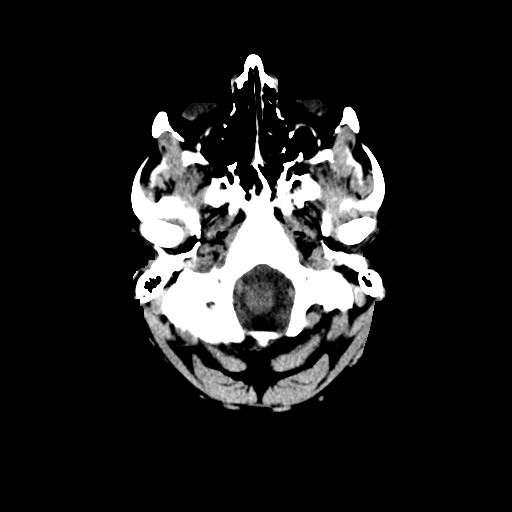
[im 2/32  bone]
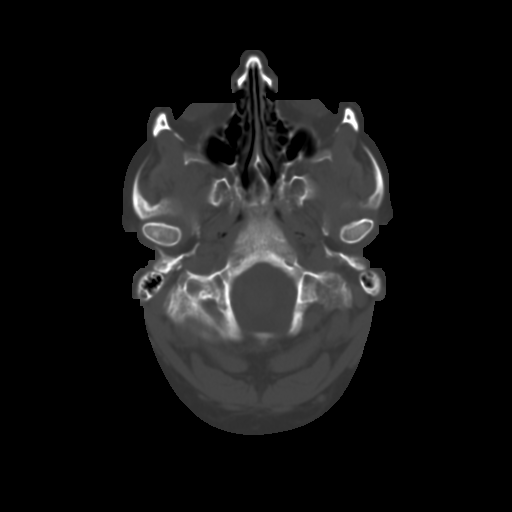
[im 4/32  brain]
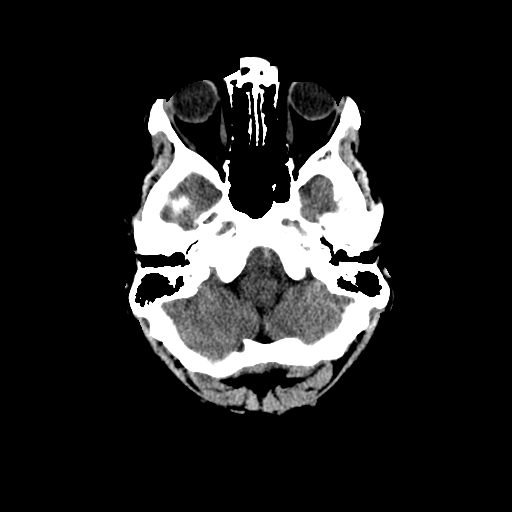
[im 6/32  brain]
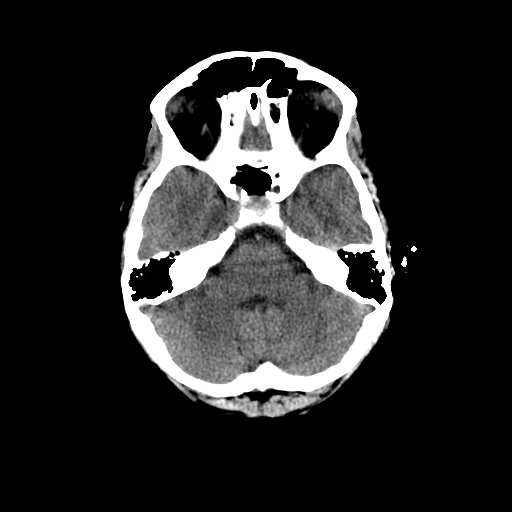
[im 8/32  brain]
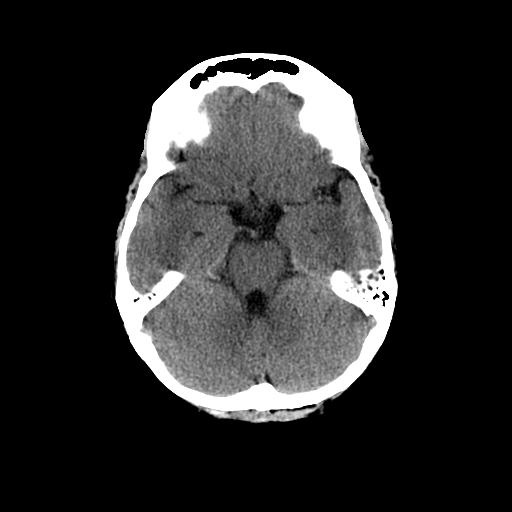
[im 9/32  brain]
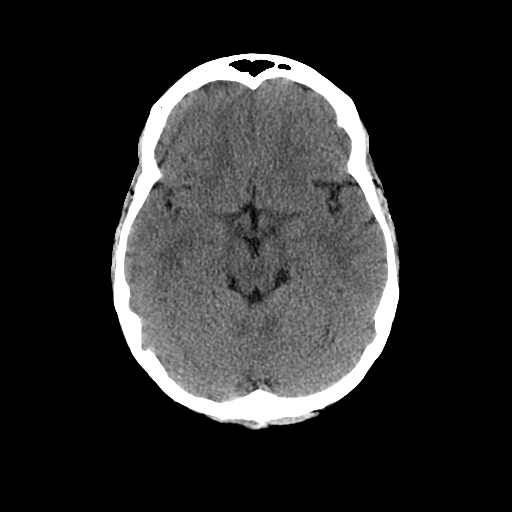
[im 9/32  bone]
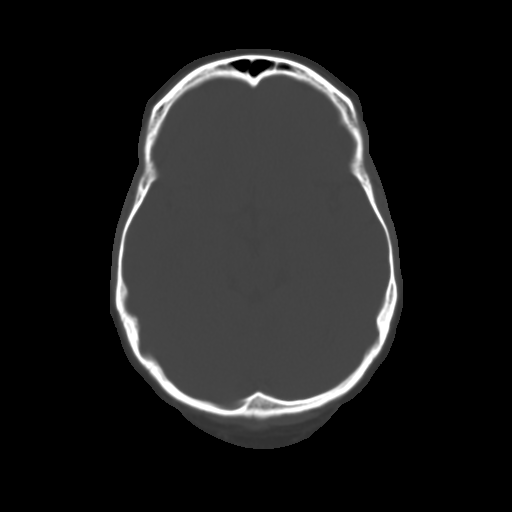
[im 11/32  brain]
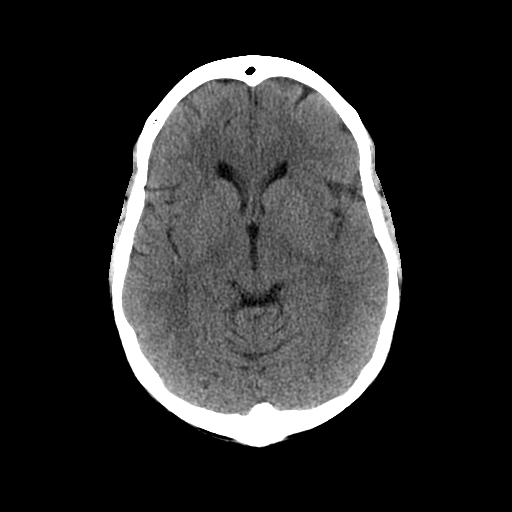
[im 13/32  brain]
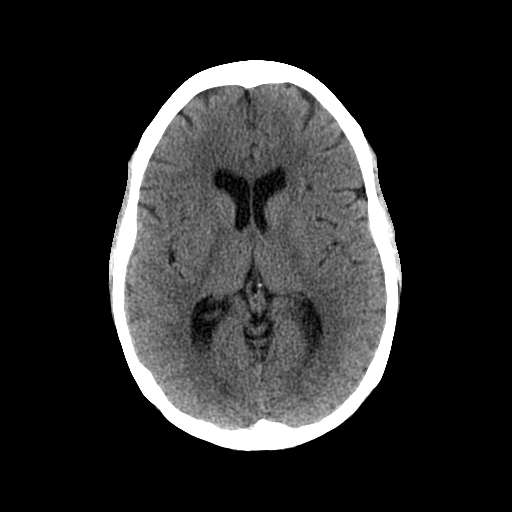
[im 15/32  brain]
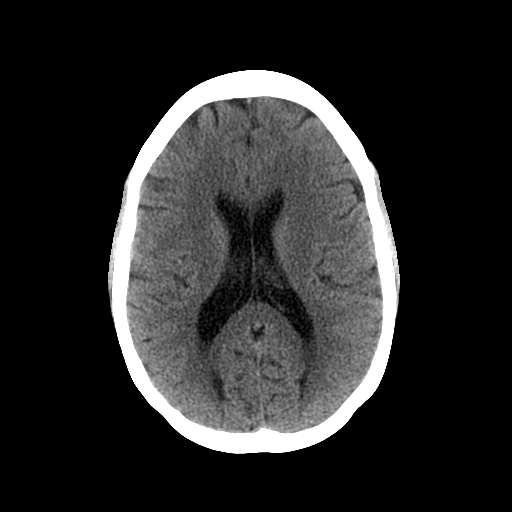
[im 17/32  brain]
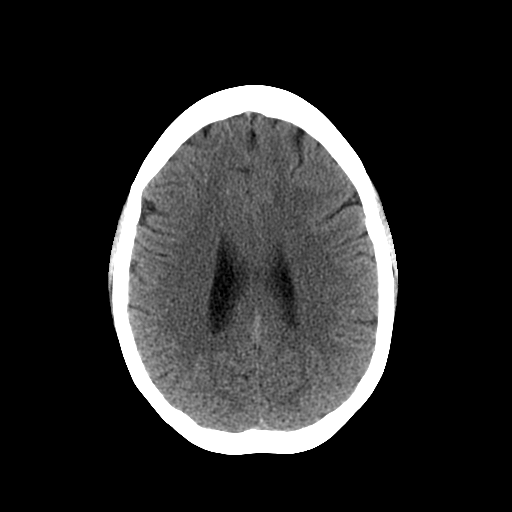
[im 17/32  bone]
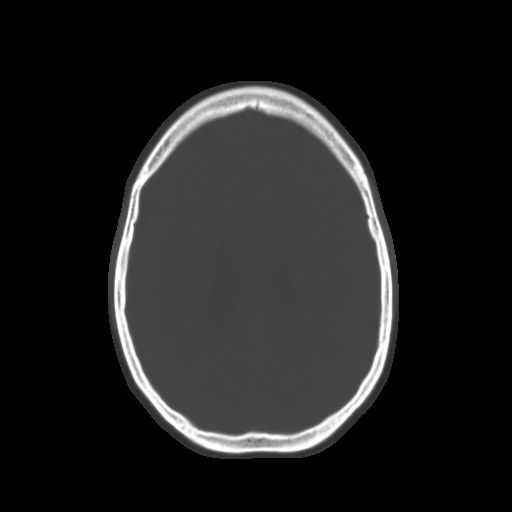
[im 19/32  brain]
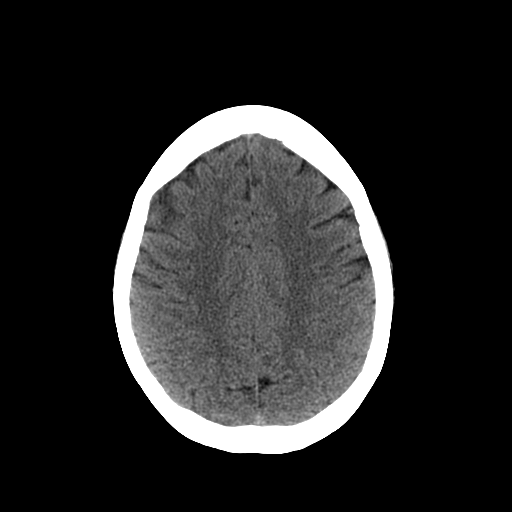
[im 21/32  brain]
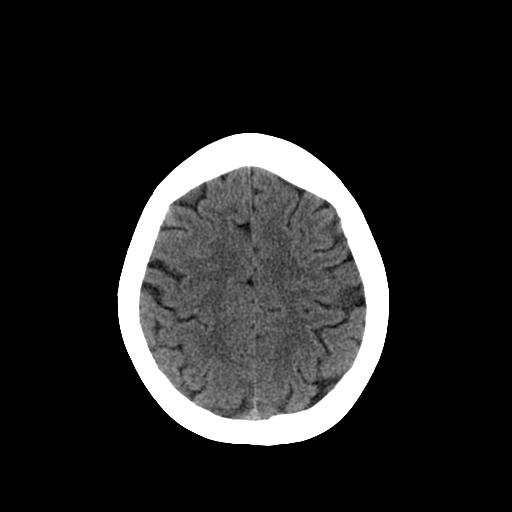
[im 23/32  brain]
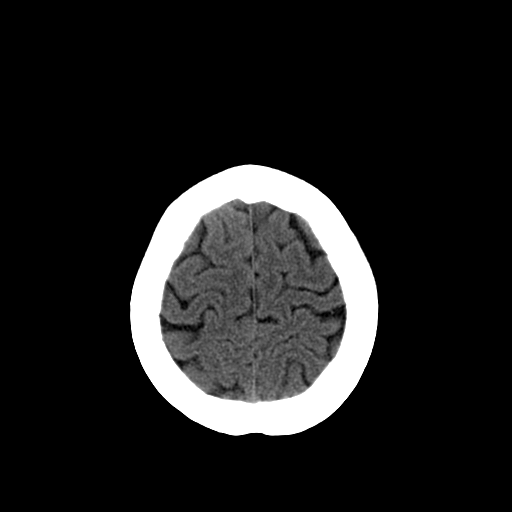
[im 24/32  brain]
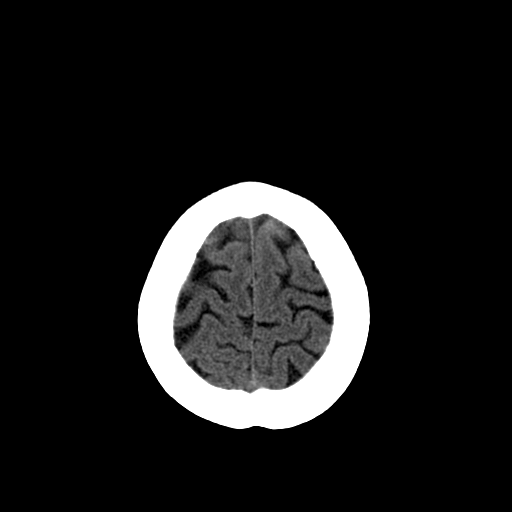
[im 24/32  bone]
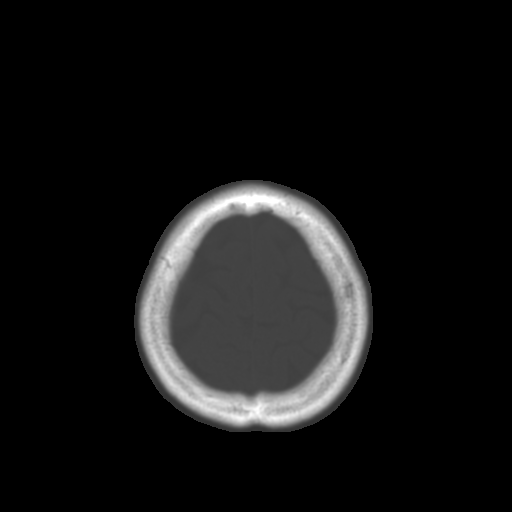
[im 26/32  brain]
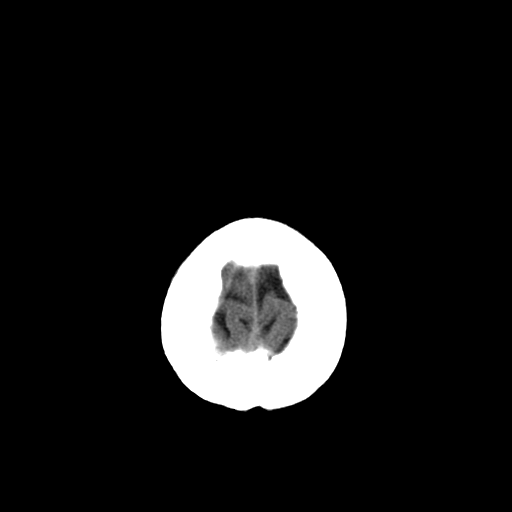
[im 28/32  brain]
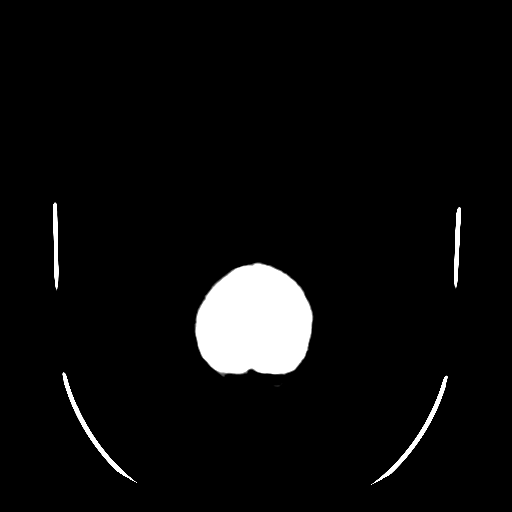
[im 30/32  brain]
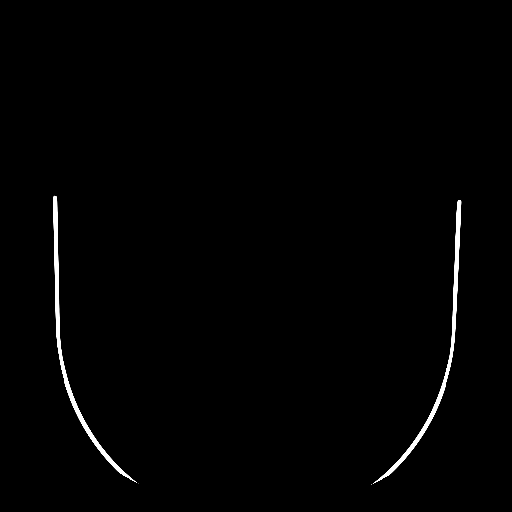

[16 of 30 positions shown; findings below may reference images not displayed]

FINDINGS: The bony calvarium and skull base are intact.  The visualized paranasal sinuses and mastoid air cells are clear.
No intracranial hemorrhage, mass-effect, or midline shift.  The ventricles, sulci, and cisterns are normal in size and configuration.  Normal gray white matter differentiation.  No abnormal extra-axial fluid collections.
IMPRESSION: 
IMPRESSION: No acute intracranial abnormality
Location 8
Is the patient pregnant?
Unknown

## 2019-07-17 IMAGING — DX XR CHEST 1 VIEW
1 series · 1 of 1 positions shown · non-contrast
Comparison: none

FRONTAL  CHEST.
INDICATION: dizziness

[AP]
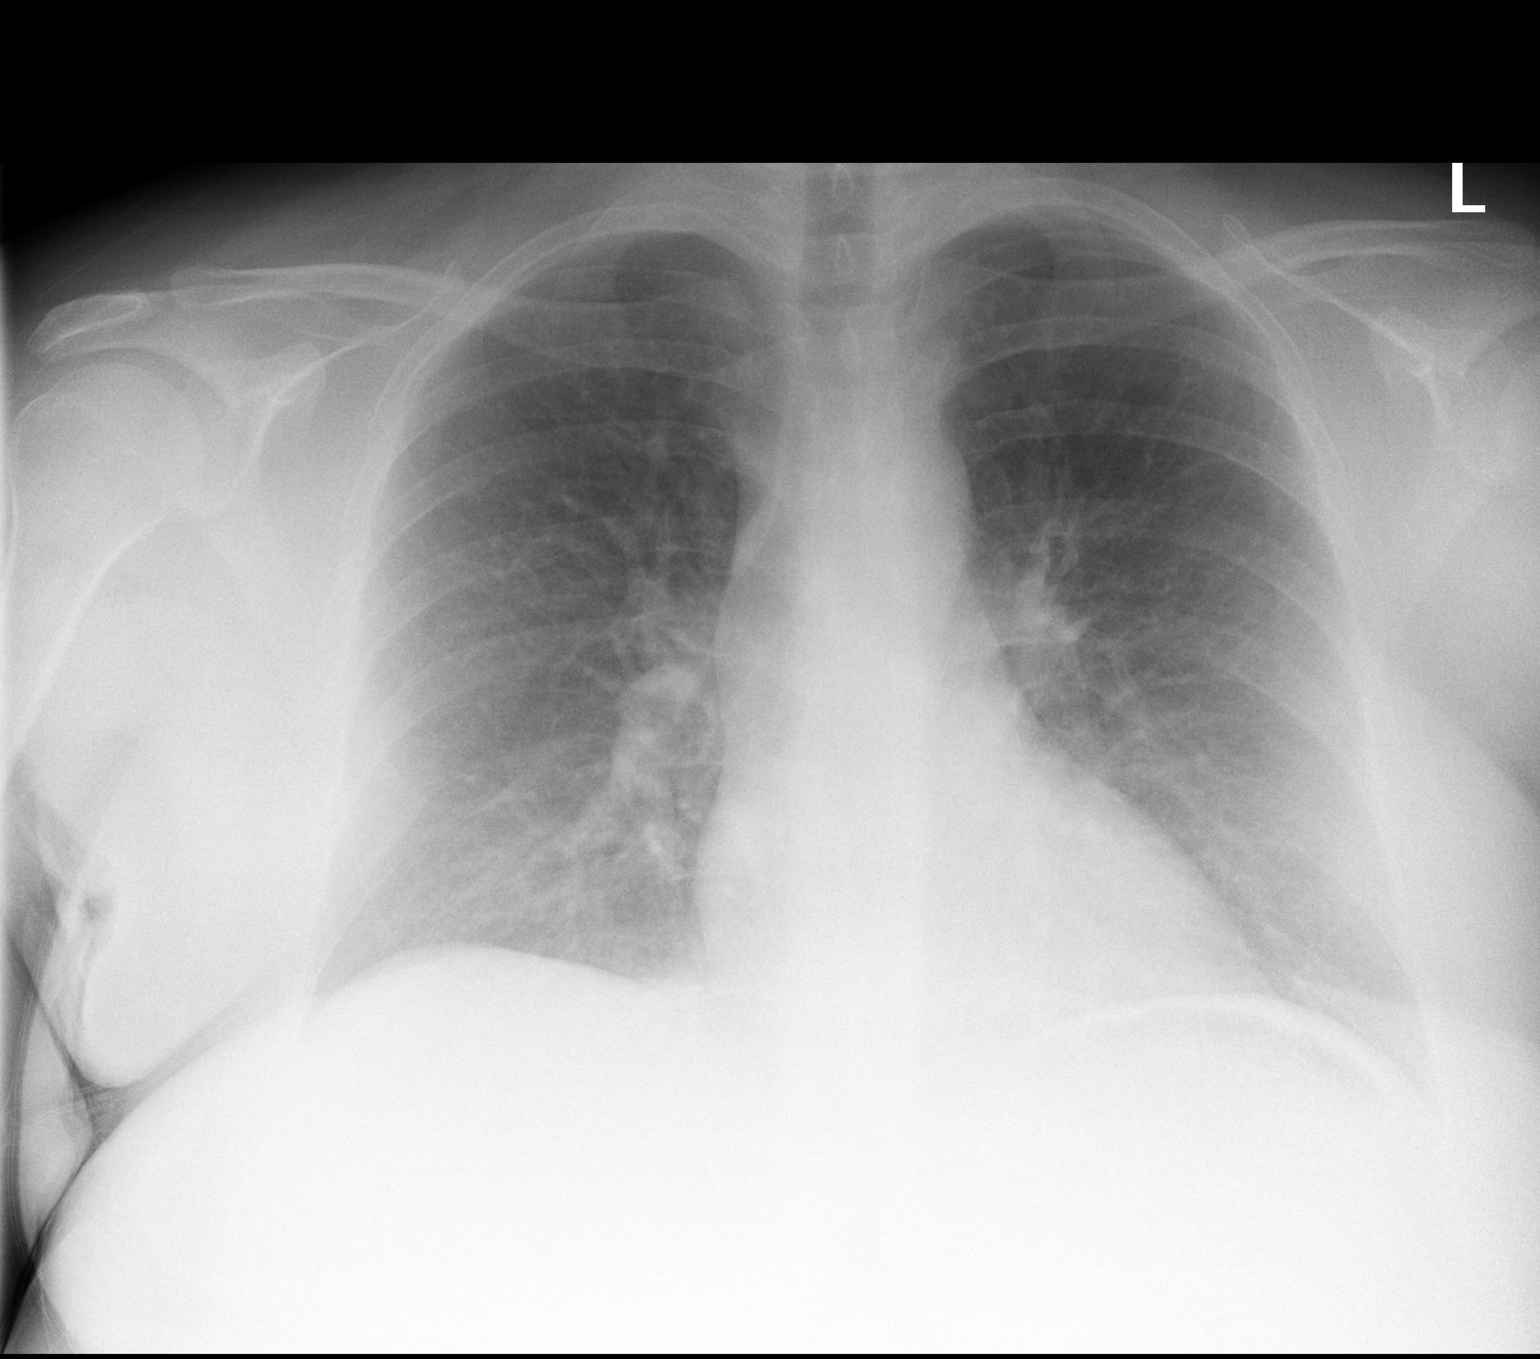

[1 of 1 positions shown; findings below may reference images not displayed]

FINDINGS: Frontal chest radiograph.  Normal heart size and mediastinal contours.  Lungs and pleural spaces are clear.  No pneumothorax.  The osseous structures are grossly intact.
IMPRESSION: No acute cardiopulmonary process
Location 8
Is the patient pregnant?
No

## 2019-08-07 IMAGING — CT CT ABDOMEN PELVIS WO CONTRAST
2 of 3 series · 17 of 46 positions shown, 19 images · non-contrast
Comparison: none

EXAM: CT abdomen and pelvis without contrast
CLINICAL HISTORY: right flank pain
TECHNIQUE: Contiguous axial slices from the top of the kidneys through the pubic symphysis were obtained without the use of intravenous administration or oral contrast, using renal stone protocol.
Imaging of the solid organs and vasculature is limited secondary to lack of IV contrast.  Imaging of the bowel is limited secondary to lack of oral contrast.

[Series 2: renal stone · axial · 0.84mm/px · z∈[-519,-134]mm · 14 of 178 slices shown, 16 images]
[im 12/178  soft-tissue]
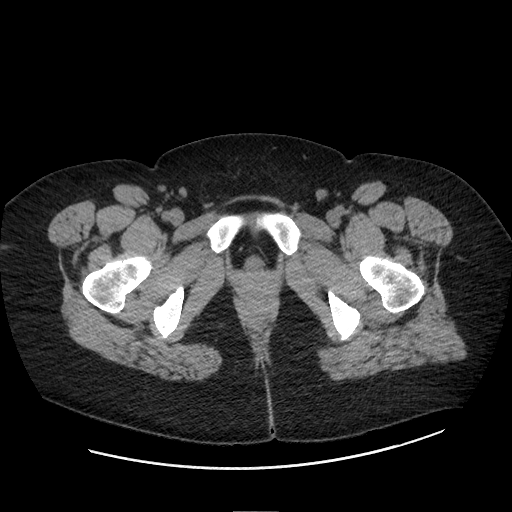
[im 12/178  bone]
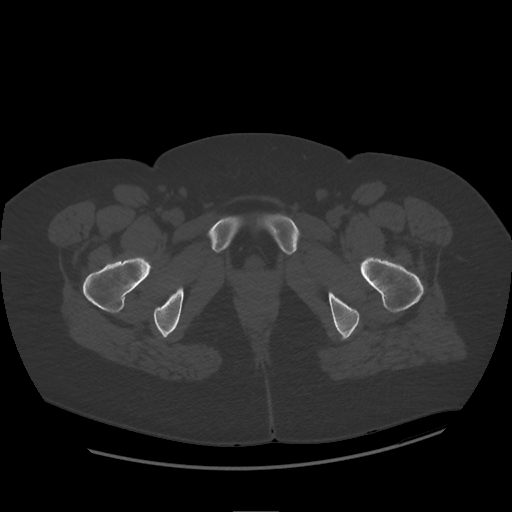
[im 23/178  soft-tissue]
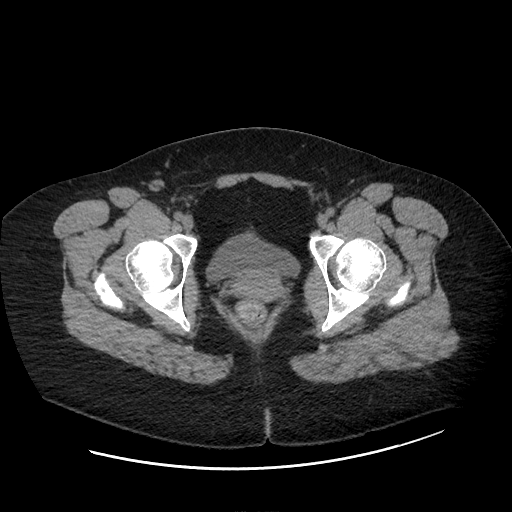
[im 35/178  soft-tissue]
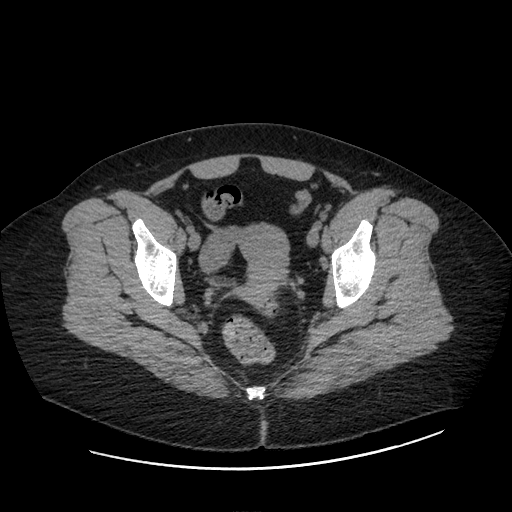
[im 46/178  soft-tissue]
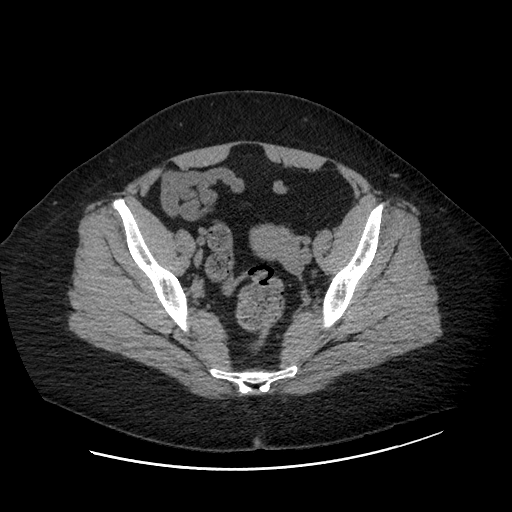
[im 58/178  soft-tissue]
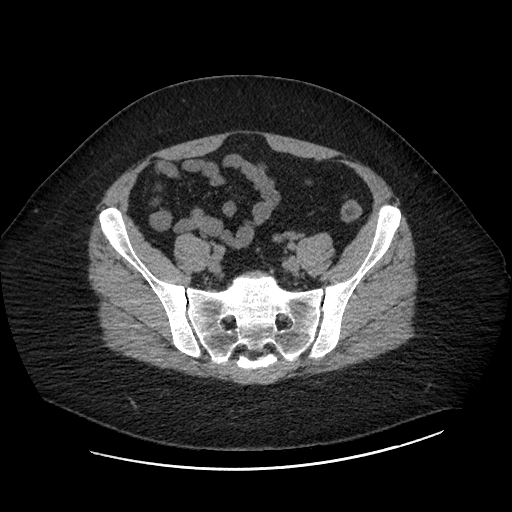
[im 69/178  soft-tissue]
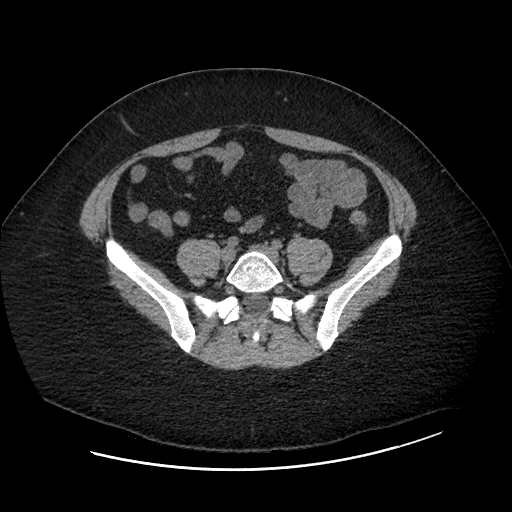
[im 80/178  soft-tissue]
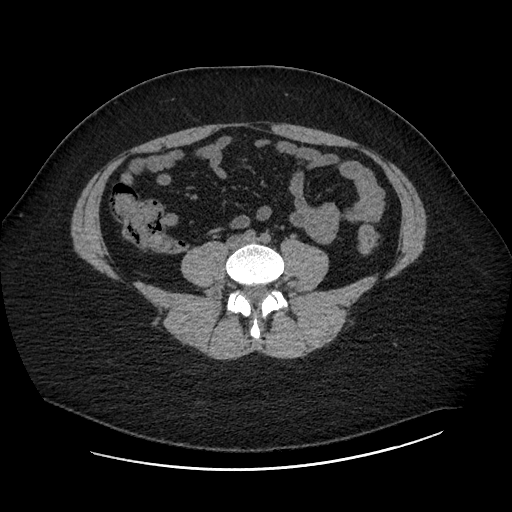
[im 98/178  soft-tissue]
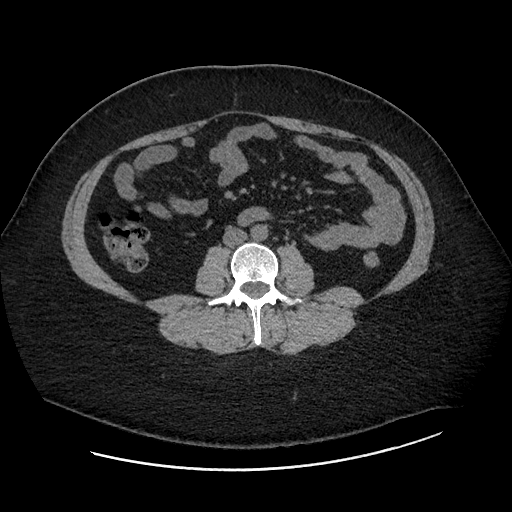
[im 109/178  soft-tissue]
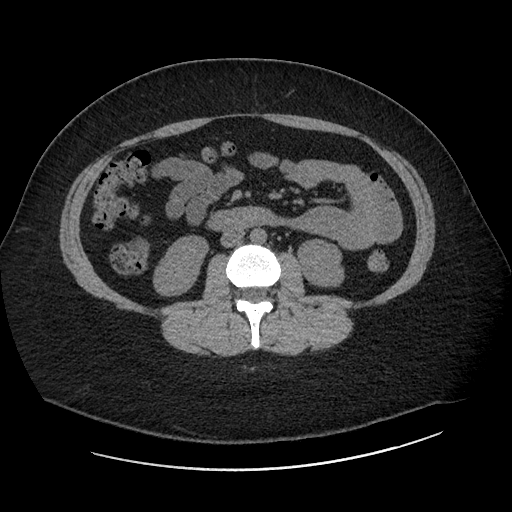
[im 109/178  bone]
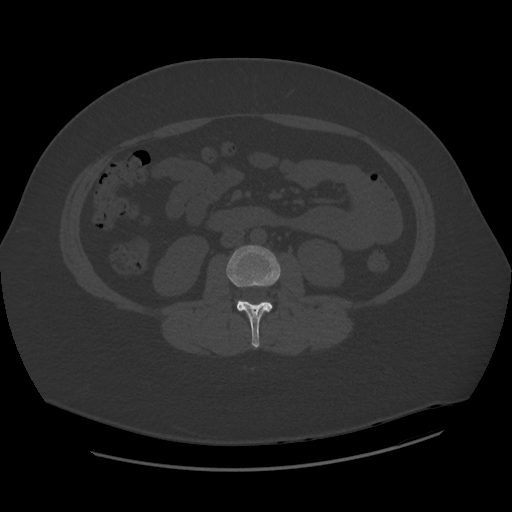
[im 120/178  soft-tissue]
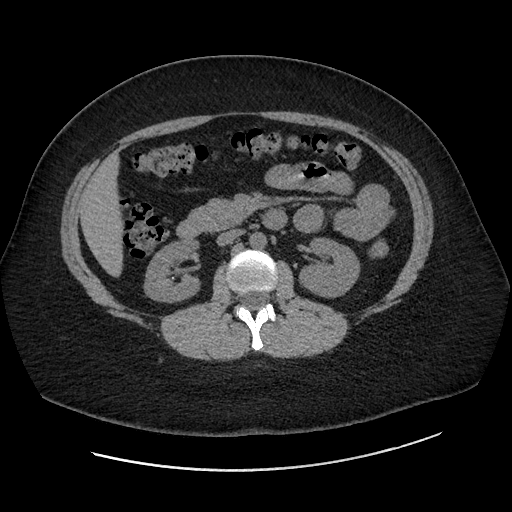
[im 132/178  soft-tissue]
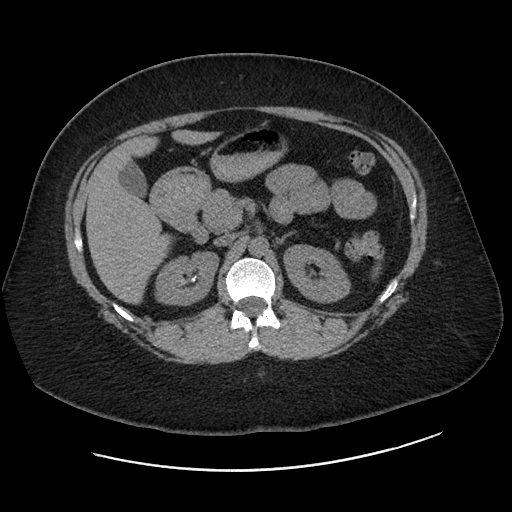
[im 143/178  soft-tissue]
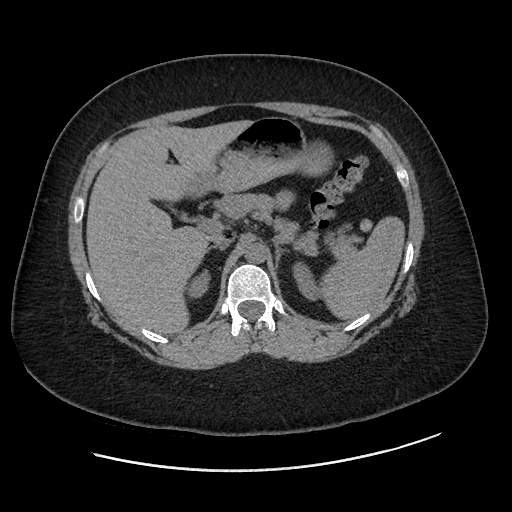
[im 155/178  soft-tissue]
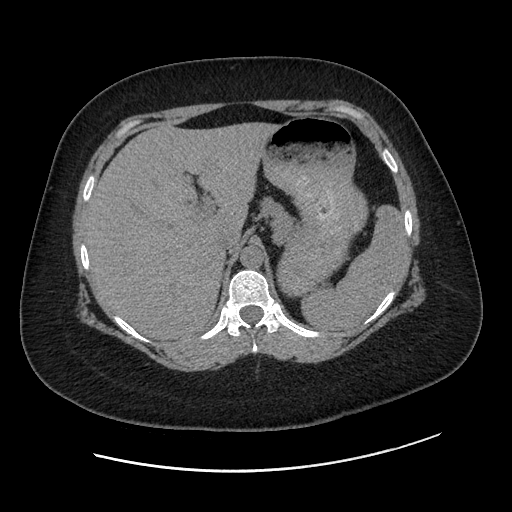
[im 166/178  soft-tissue]
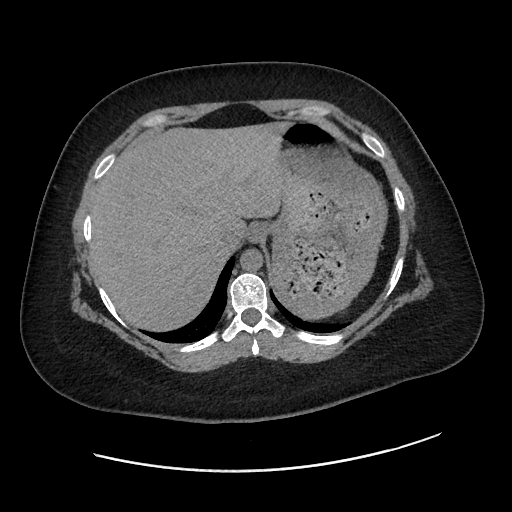

[Series 602: sag standard 2x2 · sagittal · 0.87mm/px · 3 of 216 slices shown]
[im 72/216  soft-tissue]
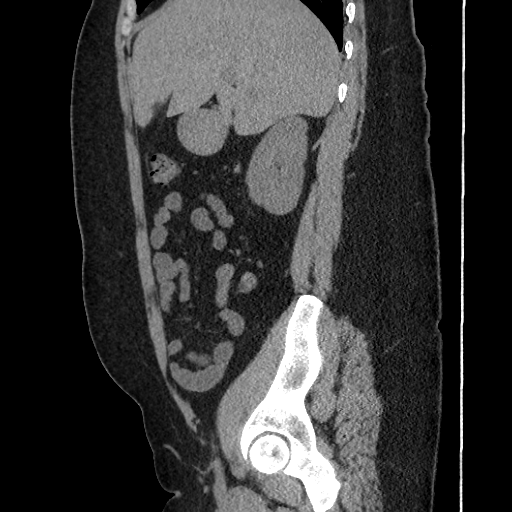
[im 96/216  soft-tissue]
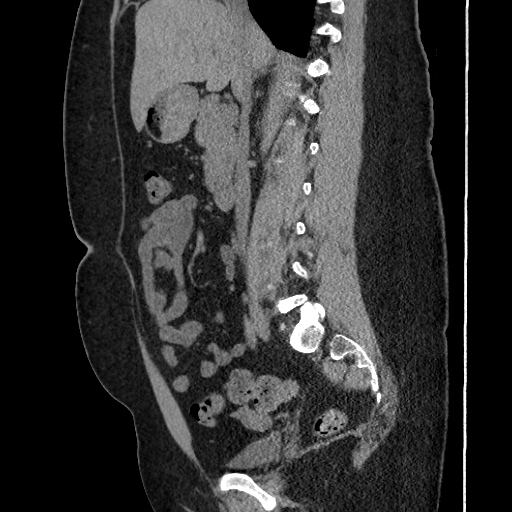
[im 120/216  soft-tissue]
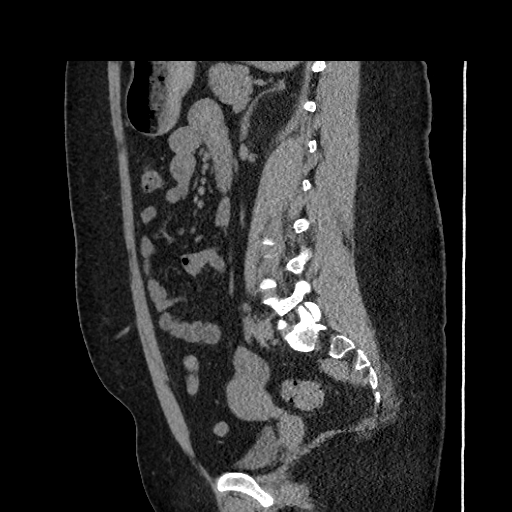

[17 of 46 positions shown; findings below may reference images not displayed]

FINDINGS: Comparison is made with study from February 10, 2019.
Included lung bases are normal. Included liver, spleen, adrenals, and pancreas are normal. The gallbladder is unremarkable. There is no CT evidence for gallstones. Aorta is normal in caliber. No retroperitoneal mass is seen.
The kidneys are normal in caliber and contour. Multiple calculi are seen scattered about the right and left renal parenchyma. On the right measure between 1 to 4 mm. On the left, measure between 1 to 5 mm. There is no evidence for hydronephrosis or hydroureter. There is no evidence for ureteral calculi either on the right or the left.
Lack of oral contrast limits evaluation the bowel. There are no signs of bowel structure. There is no free air or free fluid. Small fat-containing of focal hernia is noted. Appendix is normal in the right lower quadrant. The periappendiceal fat is normal in density.
Urinary bladder is not optimally inflated but appears grossly unremarkable. Cystic foci in the bilateral adnexa are noted, nonspecific, likely reflect ovary containing cysts.
The bony structures are grossly intact. Alignment of vertebral bodies is normal. Intervertebral discs as well as vertebral bodies maintain normal height. No acute fracture lines are visible.
IMPRESSION: Nonobstructive bilateral renal calculi, multiple. No evidence for hydronephrosis or hydroureter. No definite evidence for ureteral calculi, either on the right or the left
Location 12
Is the patient pregnant?
Unknown

## 2019-09-06 IMAGING — US US RENAL COMPLETE
1 series · 14 of 25 positions shown · non-contrast
Comparison: none

EXAM:  RENAL ULTRASOUND:
CLINICAL INDICATION:  acute right flank pain
REFERENCE:  CT abdomen and pelvis of 08/07/2019
TECHNIQUE: Grayscale and color transabdominal imaging was obtained of the kidneys and collecting systems bilaterally.

[Series 1: us renal complete · 14 of 52 slices shown]
[im 1/52]
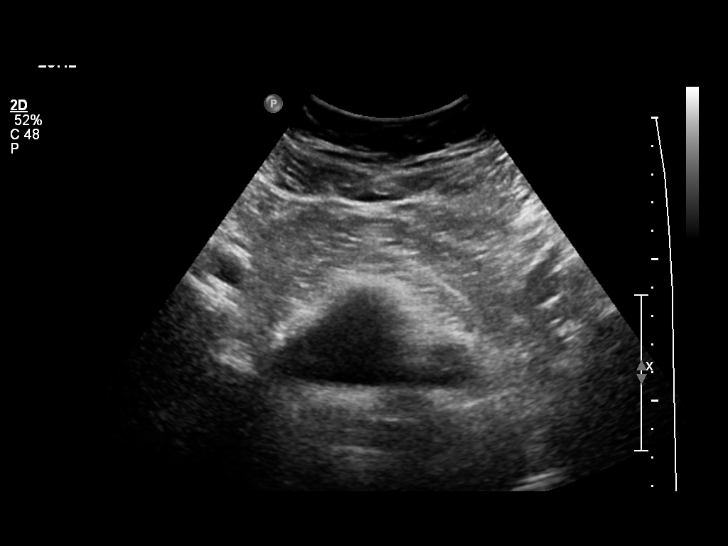
[im 5/52]
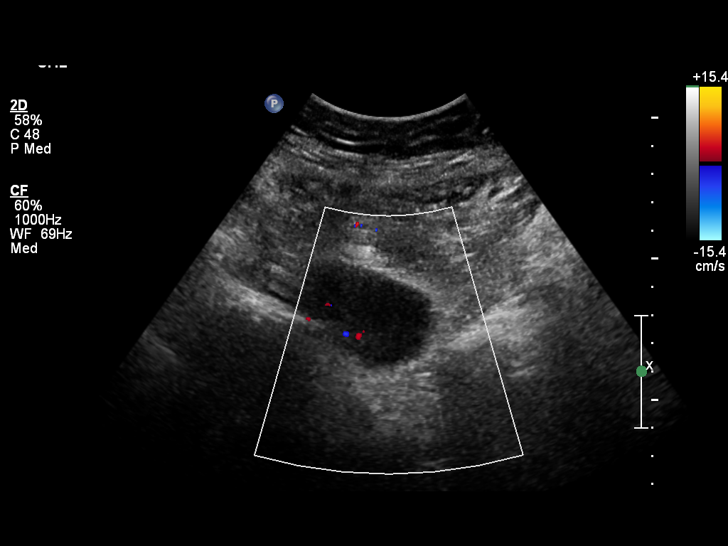
[im 9/52]
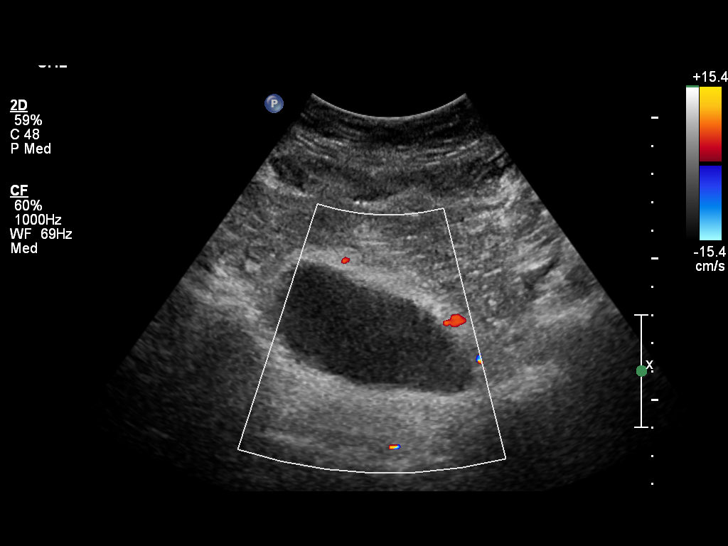
[im 13/52]
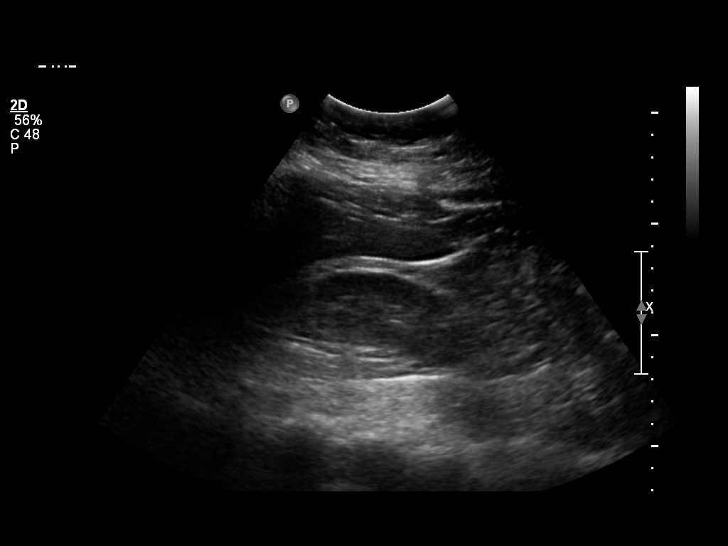
[im 18/52]
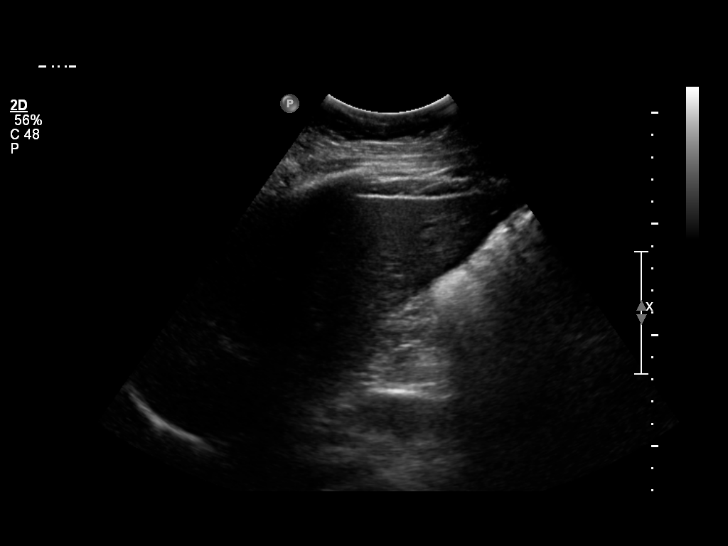
[im 20/52]
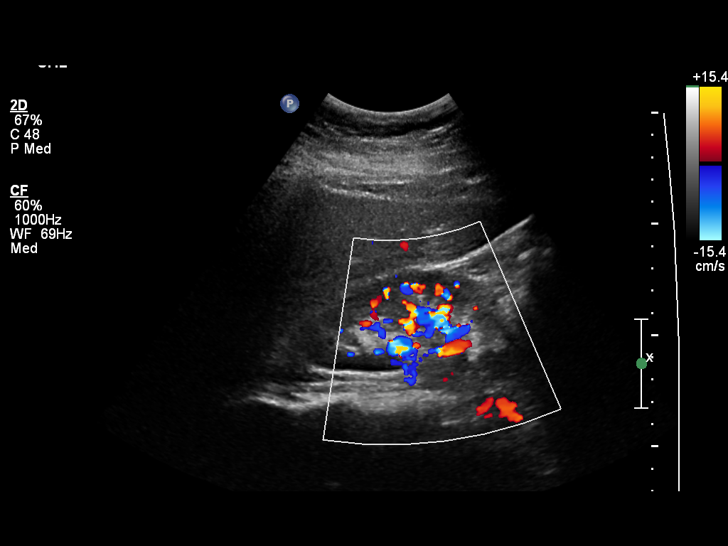
[im 24/52]
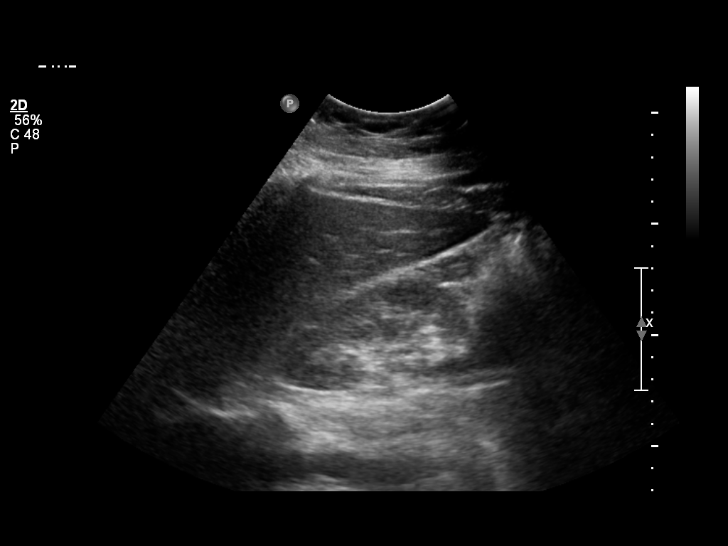
[im 28/52]
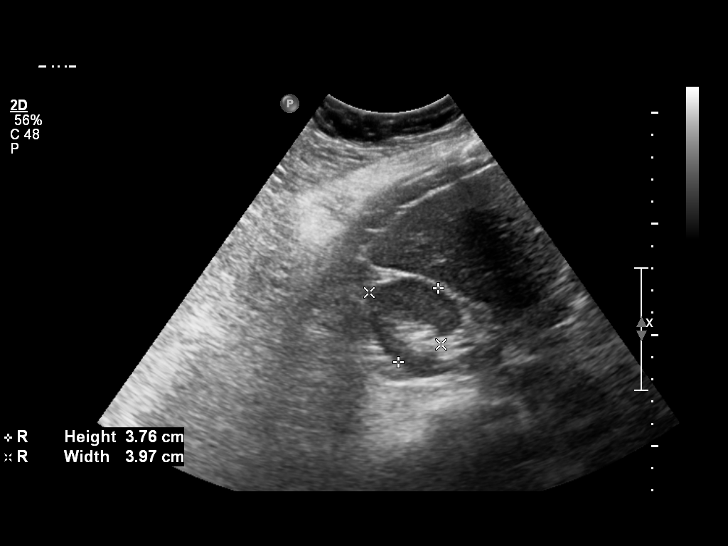
[im 32/52]
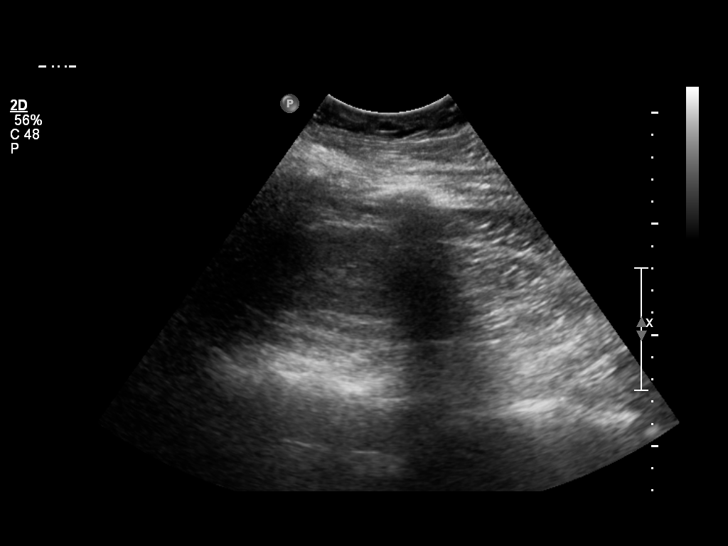
[im 35/52]
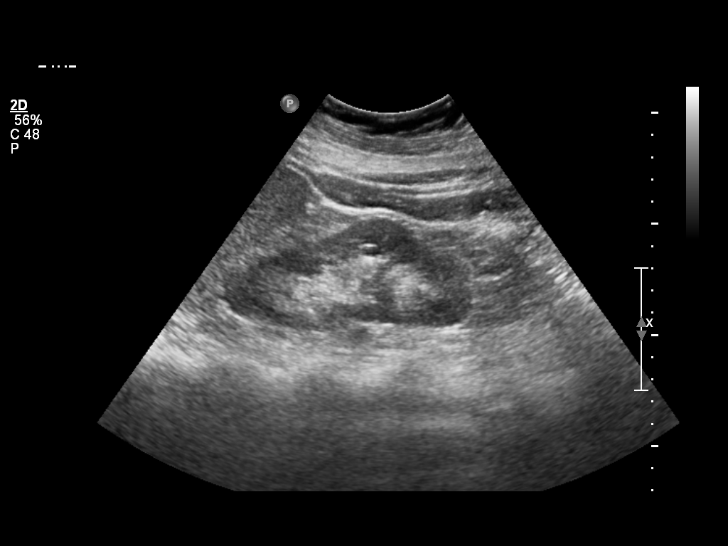
[im 39/52]
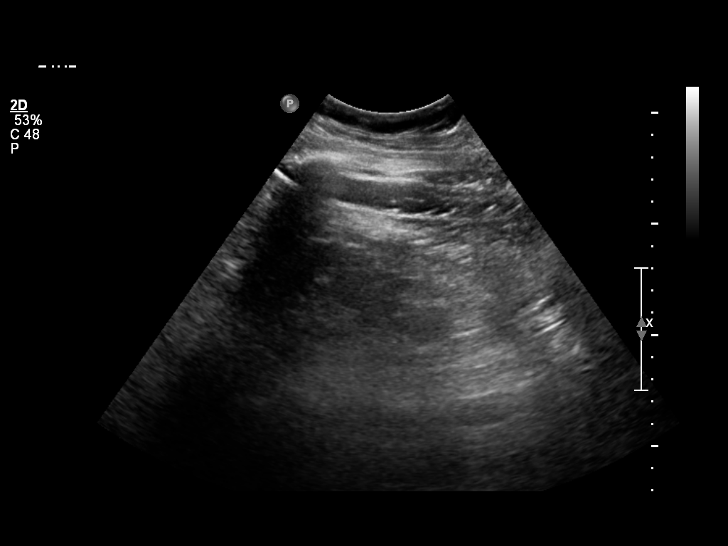
[im 43/52]
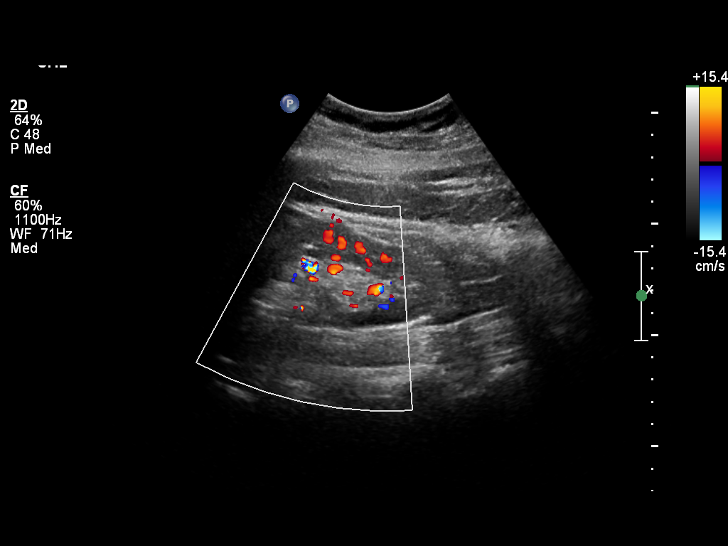
[im 47/52]
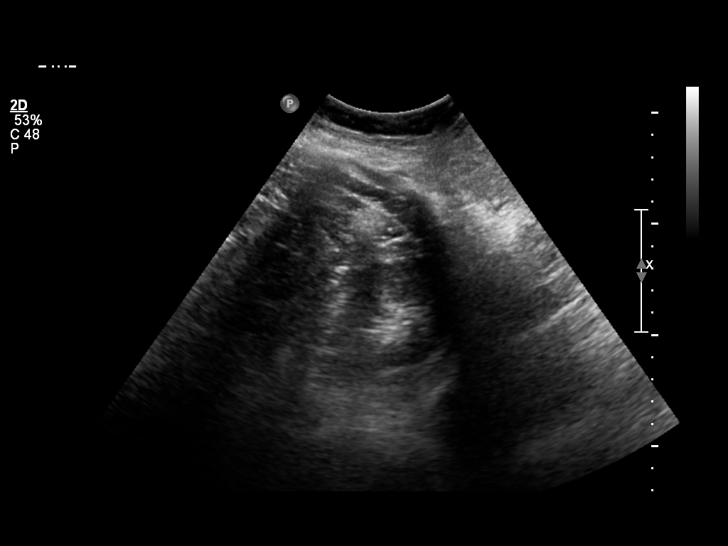
[im 52/52]
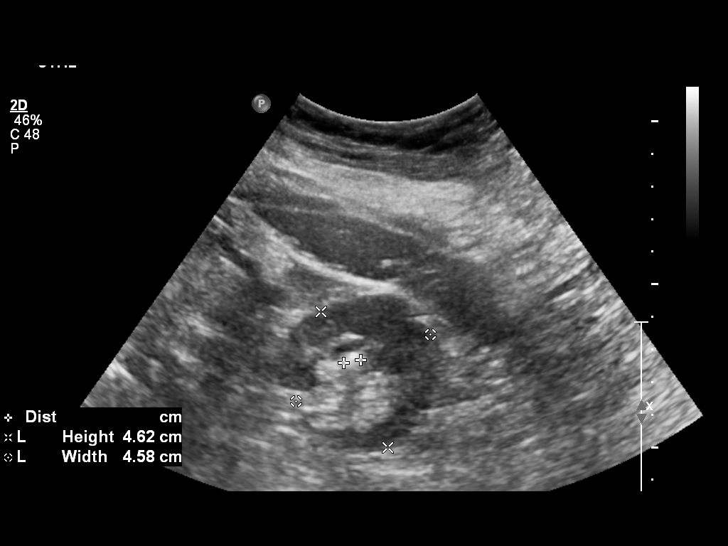

[14 of 25 positions shown; findings below may reference images not displayed]

FINDINGS: The right kidney measures: 11.2 cm.
The left kidney measures: 11.5 cm.
Normal cortical thickness and echogenicity is demonstrated. Bilateral renal hyper echogenicities compatible with calculi and largest measures possibly 7 mm in the interpolar right kidney.
Doppler color ultrasound demonstrates intact bilateral renal vascular flow.
No hydronephrosis, solid mass, large or perinephric fluid collection is present.
The bladder is grossly unremarkable in appearance.  No significant postvoid residual.
IMPRESSION: 1.  Bilateral nonobstructive renal calculi.
LOCATION CODE : 1

## 2019-09-28 IMAGING — CT CT CERVICAL SPINE WO CONTRAST
2 of 3 series · 13 of 20 positions shown, 16 images · IV contrast (agent unspecified)
Comparison: None

CT OF THE CERVICAL SPINE WITHOUT CONTRAST:
CLINICAL INDICATION: 34-year-old female with cervical radiculopathy
TECHNIQUE: Multiple axial images were obtained through the cervical spine without the administration of contrast.  Subsequent coronal and sagittal reformats were then obtained.

[Series 2: c-spine stnd · axial · 0.35mm/px · z∈[-154,-9]mm · 12 of 70 slices shown, 15 images]
[im 6/70  soft-tissue]
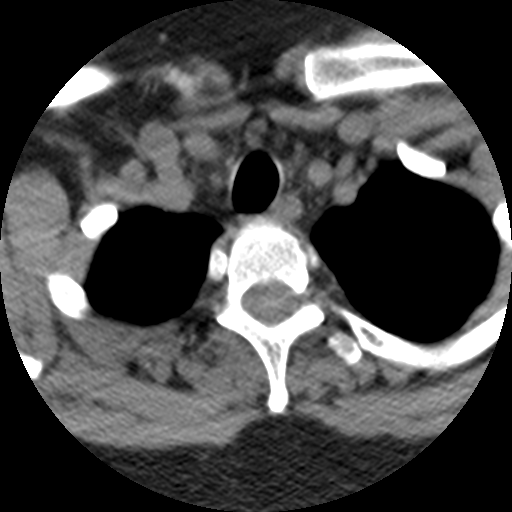
[im 6/70  bone]
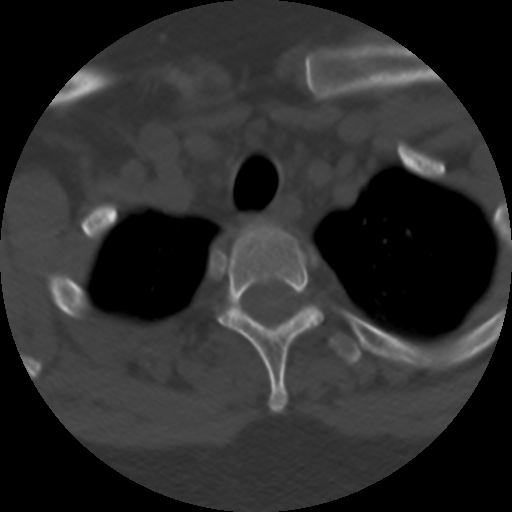
[im 11/70  bone]
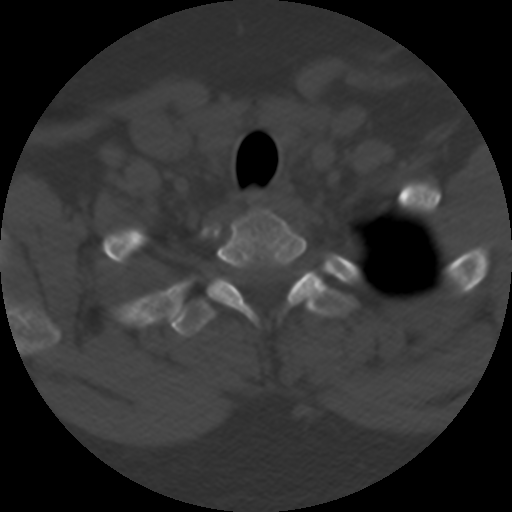
[im 16/70  bone]
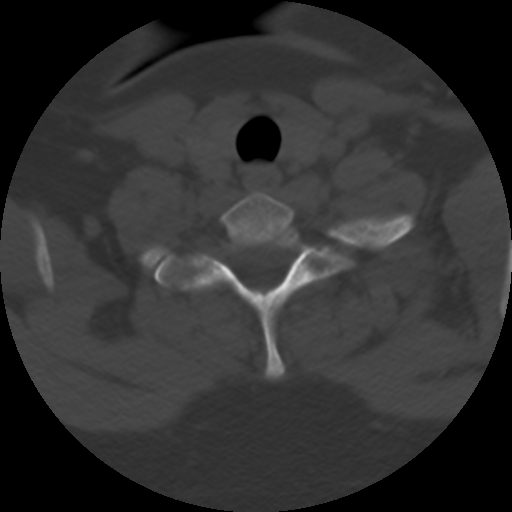
[im 22/70  bone]
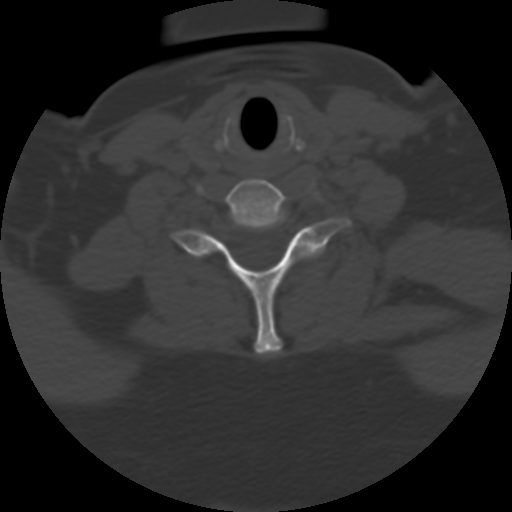
[im 27/70  soft-tissue]
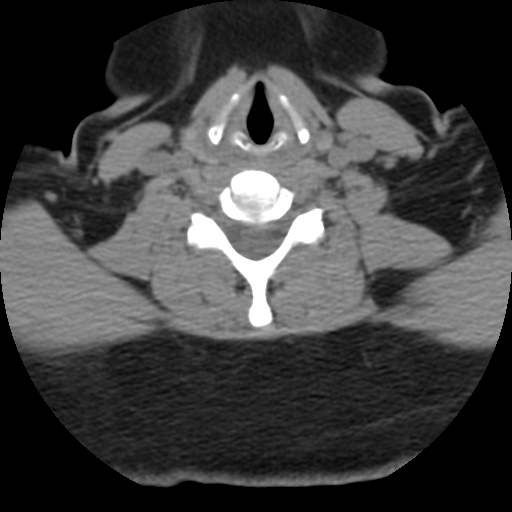
[im 27/70  bone]
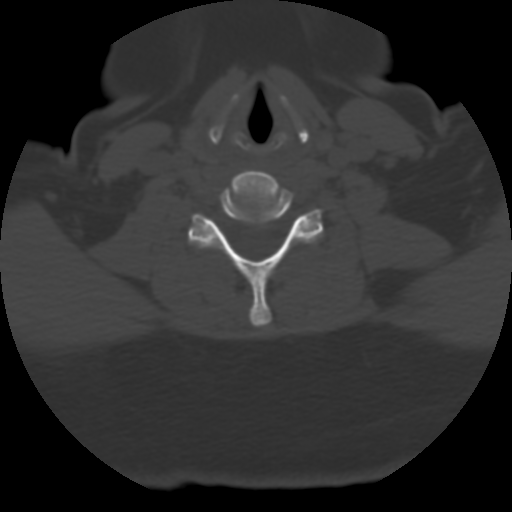
[im 32/70  bone]
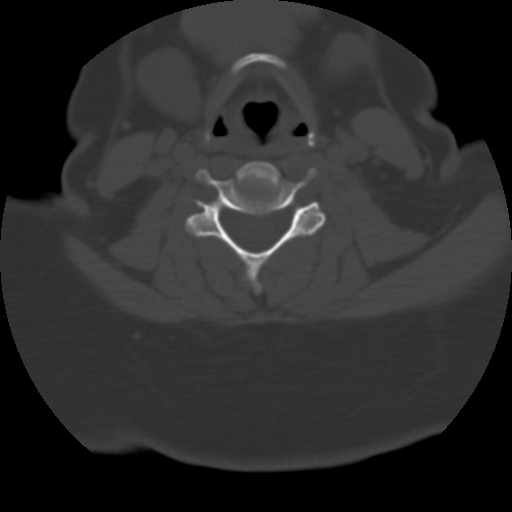
[im 38/70  bone]
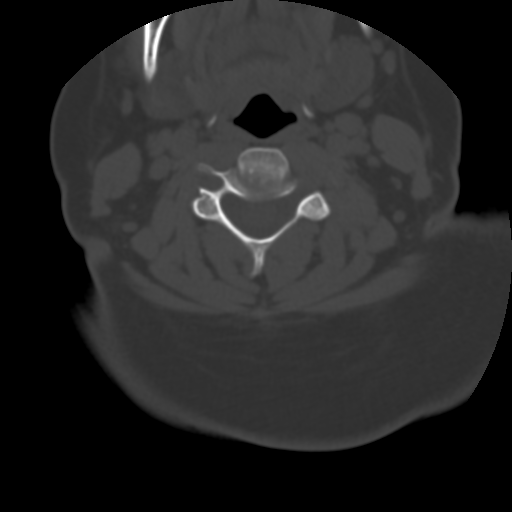
[im 43/70  bone]
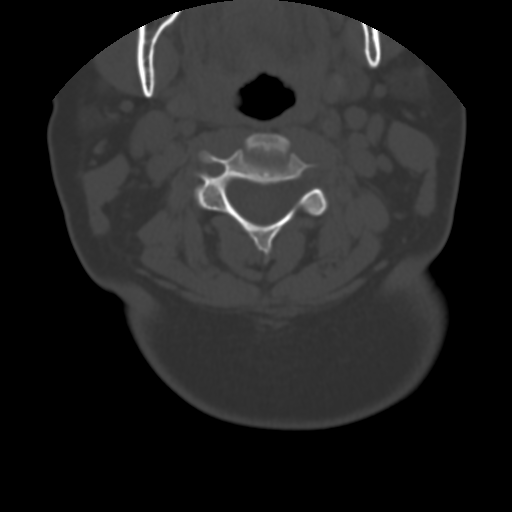
[im 48/70  soft-tissue]
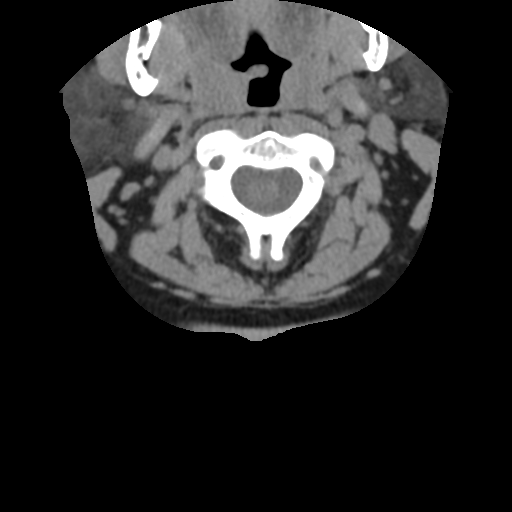
[im 48/70  bone]
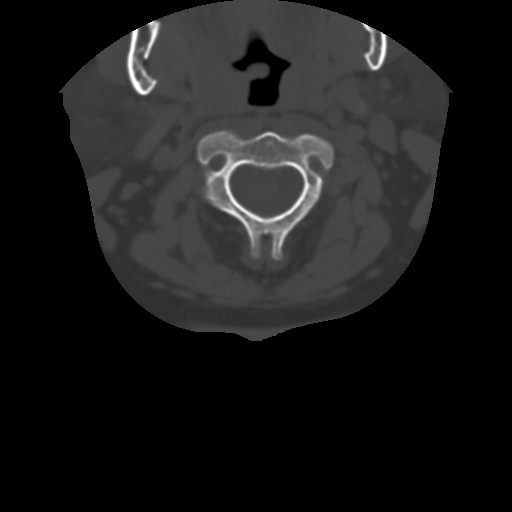
[im 54/70  bone]
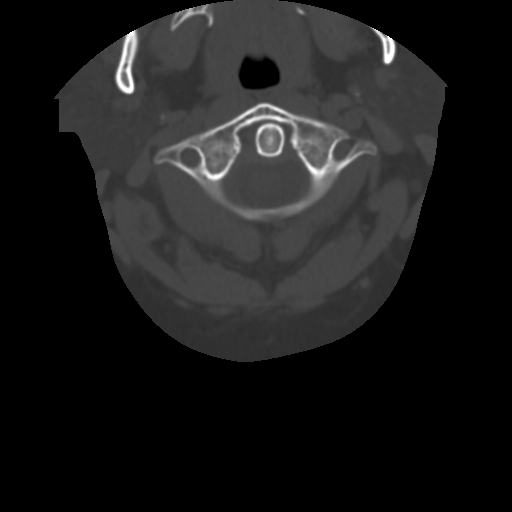
[im 59/70  bone]
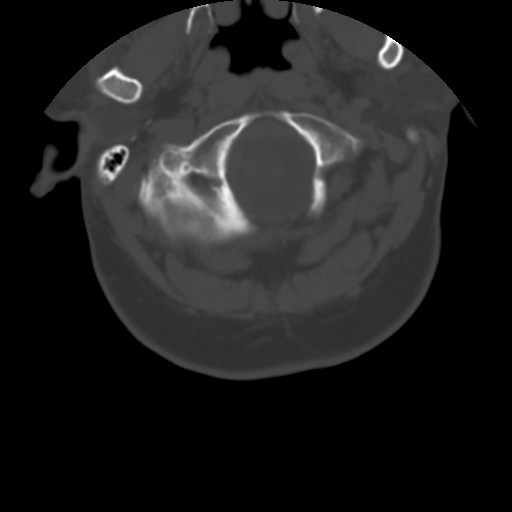
[im 64/70  bone]
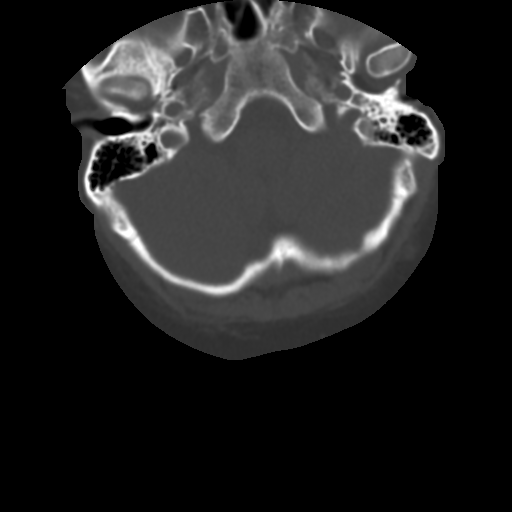

[Series 601: cor bone 2x2 · coronal · 0.35mm/px · 1 of 46 slices shown]
[im 23/46  bone]
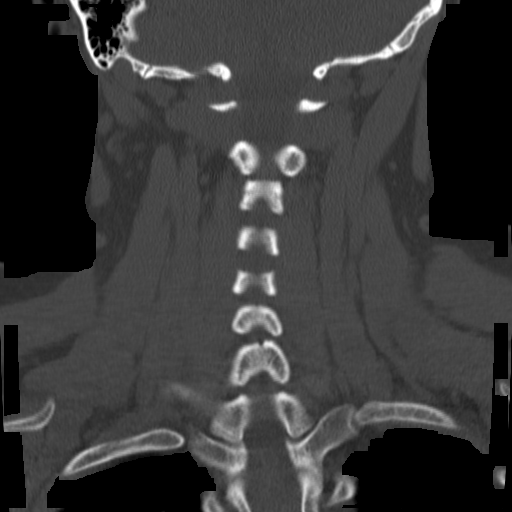

[13 of 20 positions shown; findings below may reference images not displayed]

FINDINGS: ALIGNMENT: Straightening of the cervical lordosis without listhesis, uncovering of facet joints, or significant widening of posterior spinous processes.
VERTEBRAL COLUMN: Vertebral bodies well formed without acute fracture or significant lytic/blastic mass. Mild degenerative disc and endplate changes at C5-6. Cervical facet joints appear well maintained.
CRANIOVERTEBRAL JUNCTION: No anomalies or subluxation identified.
DISK LEVELS: C2-3 to C7-T1, no significant disk herniation or bowel stenosis identified.
PARAVERTEBRAL SPACE: No mass or edema or abscess.
IMPRESSION: 1.  No evidence of large herniation or significant canal stenosis.
2.  Straightening of the cervical lordosis, which may reflect muscle spasm.
All CT scans at this facility use dose modulation, iterative reconstruction, and/or weight-based dosing when appropriate to reduce radiation dose to as low as reasonably achievable.
LOCATION CODE: 13
Is the patient pregnant?
No

## 2019-10-28 IMAGING — CR XR ABDOMEN 1 VIEW
1 series · 2 of 2 positions shown · non-contrast
Comparison: none

EXAM:   SINGLE VIEW ABDOMEN:
CLINICAL INDICATION: Calculus of kidney
Unspecified abdominal pain
REFERENCE: 08/30/2018

[Series 404: AP · 2 of 2 slices shown]
[im 1/2]
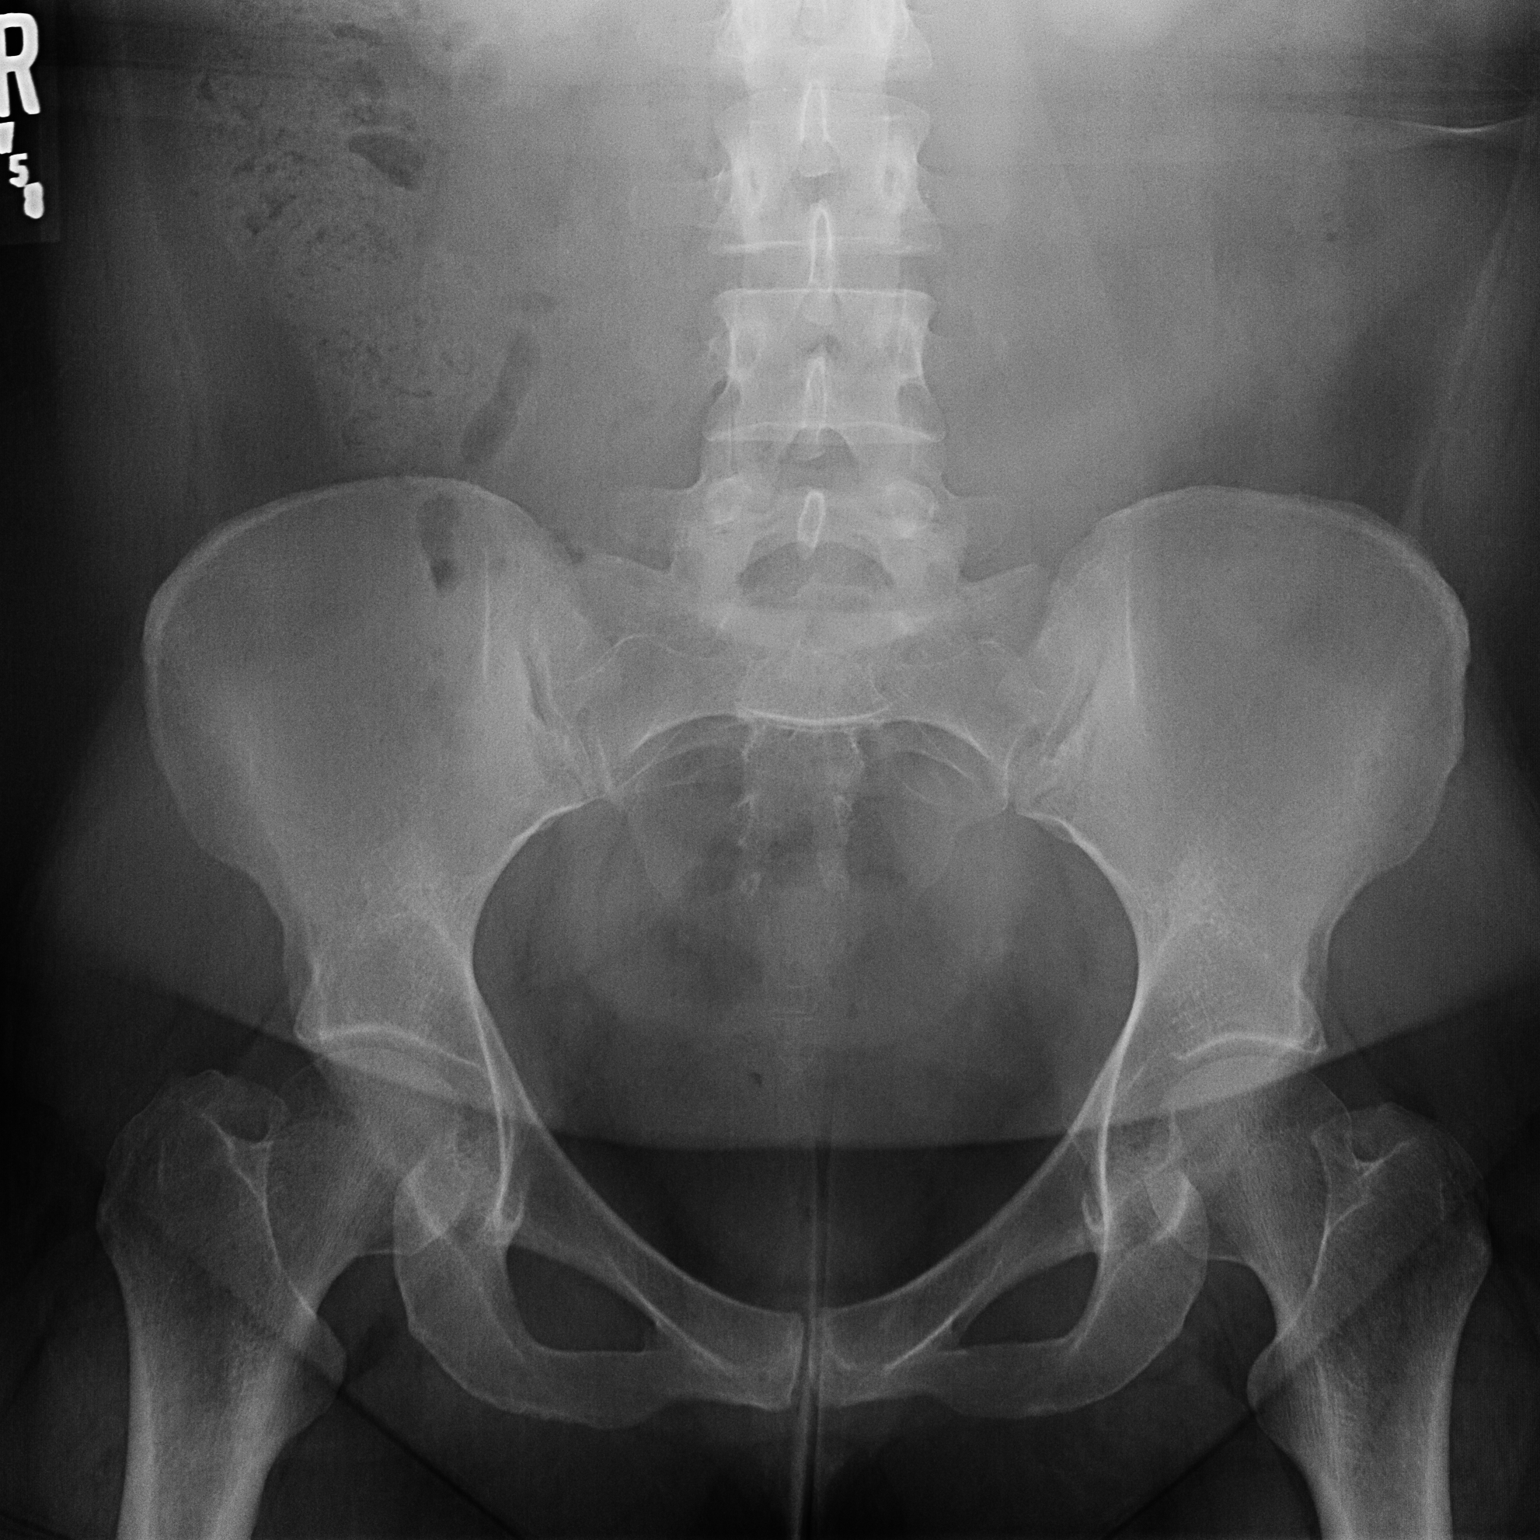
[im 2/2]
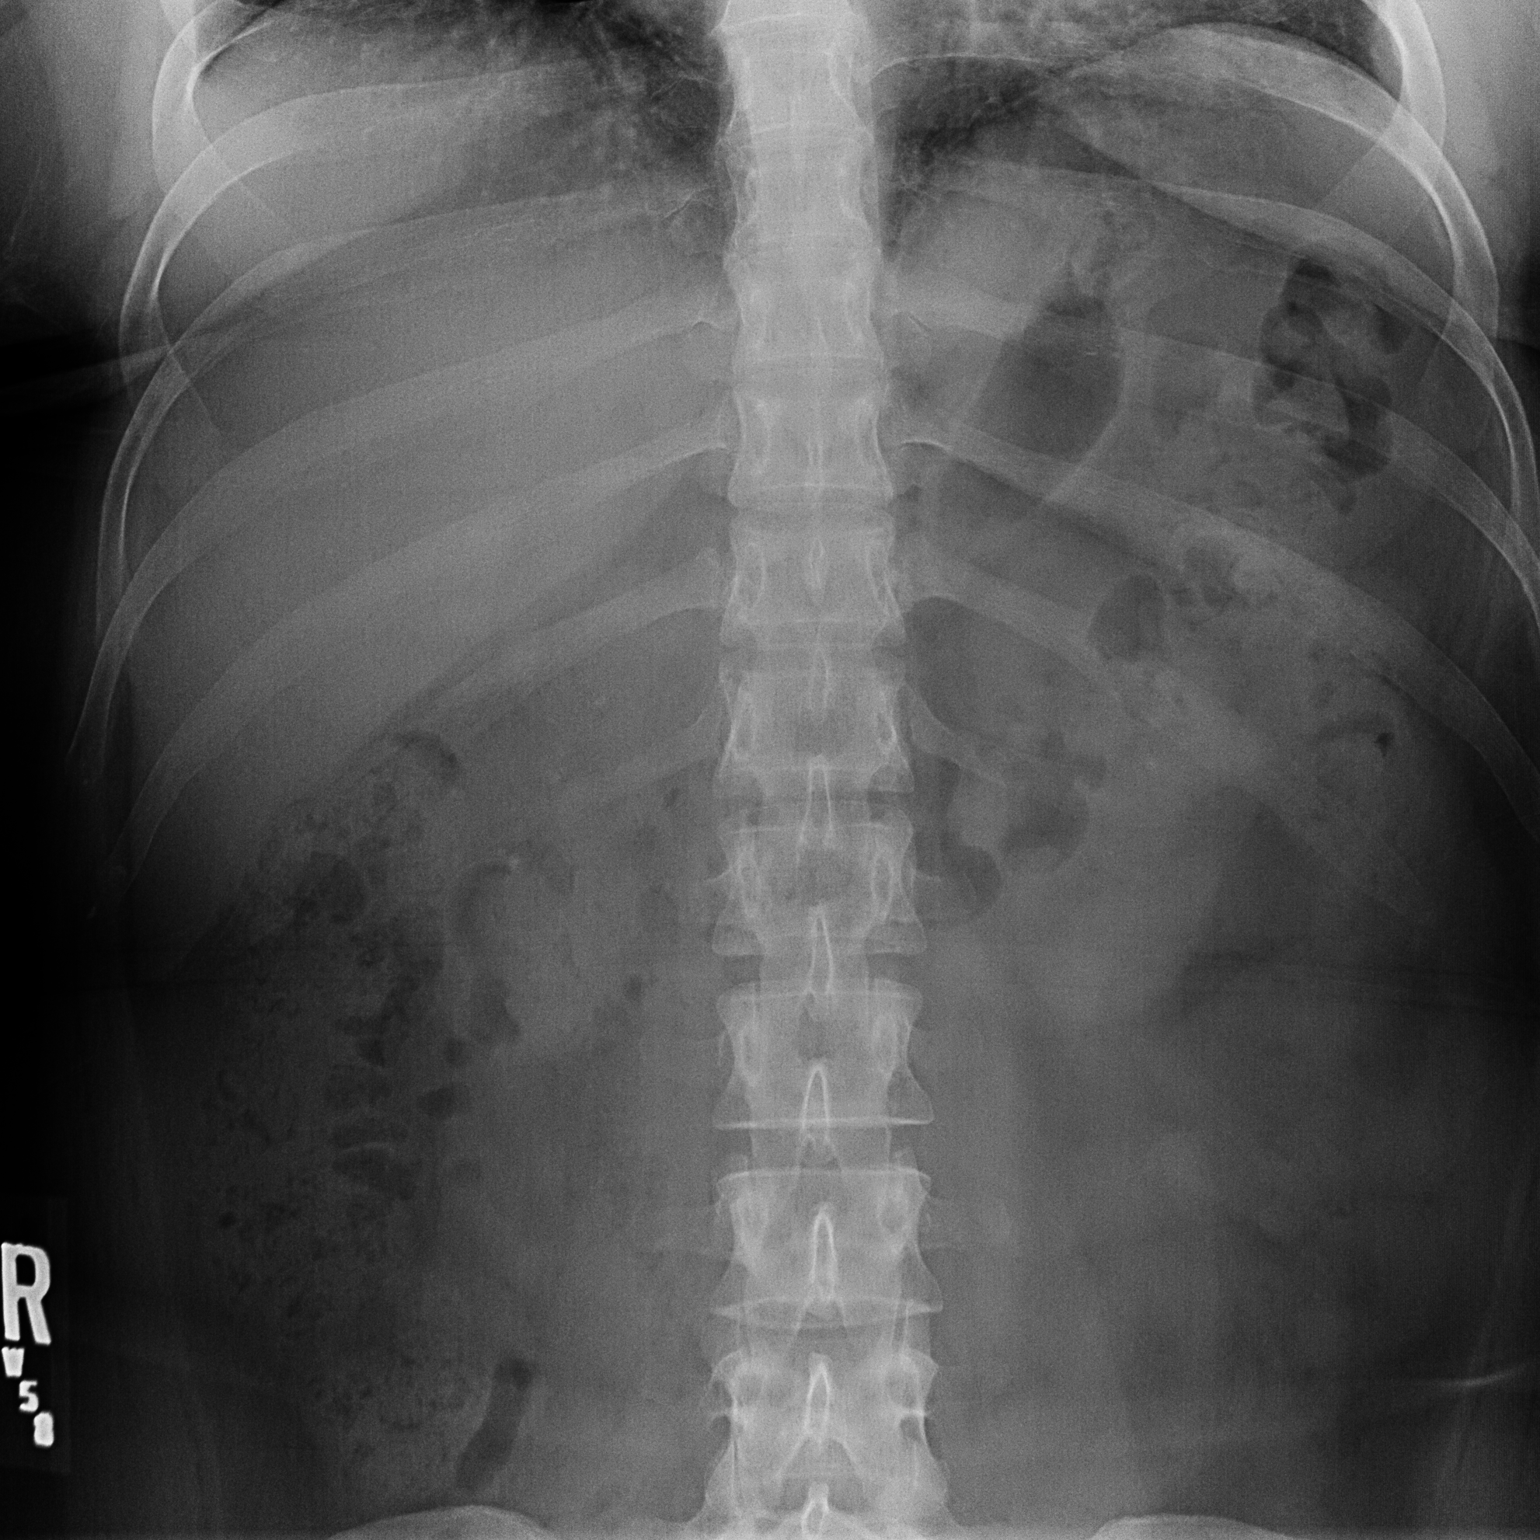

[2 of 2 positions shown; findings below may reference images not displayed]

FINDINGS: Supine views of the abdomen are presented.
The lung bases are clear. There is no consolidation, pleural effusion, pneumothorax or mass.
The heart is normal in size. The pulmonary vascularity is normal.
There is a normal nonobstructive bowel gas pattern. There is mild scattered stool.
There is no visible organomegaly, mass, free air, or ascites. There are numerous tiny 2 to 3 mm calcifications projecting over both kidneys, stable.
The bones are normal for age.
IMPRESSION: 1. Bilateral renal stones, stable.
2. Nonobstructive bowel gas pattern.
LOCATION CODE: 4
Is the patient pregnant?
No

## 2019-11-02 IMAGING — CT CT ABDOMEN PELVIS WO CONTRAST
2 of 3 series · 17 of 46 positions shown, 19 images · non-contrast
Comparison: none

Exam: CT renal stone protocol.
REASON FOR EXAM: Flank pain, kidney stone suspected
Contiguous 2.5-mm axial images were obtained from the base of the lungs down to the pubic symphysis without the administration of IV or oral contrast.

[Series 2: renal stone · axial · 0.97mm/px · z∈[-524,-87]mm · 14 of 201 slices shown, 16 images]
[im 13/201  soft-tissue]
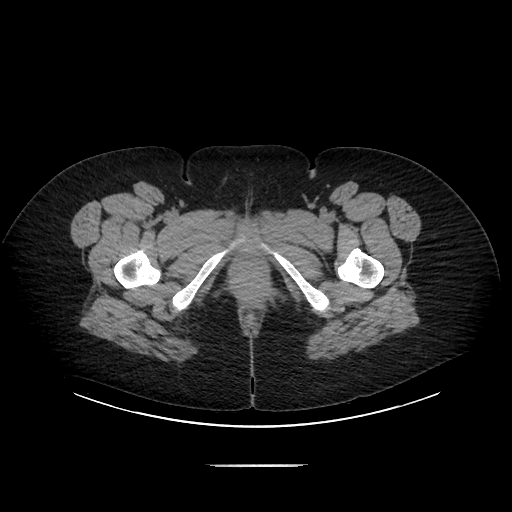
[im 13/201  bone]
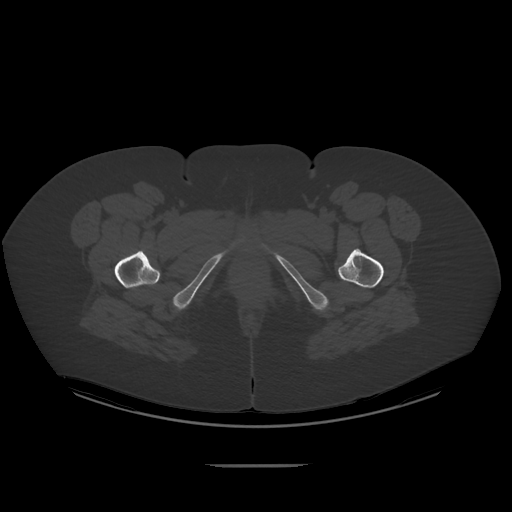
[im 26/201  soft-tissue]
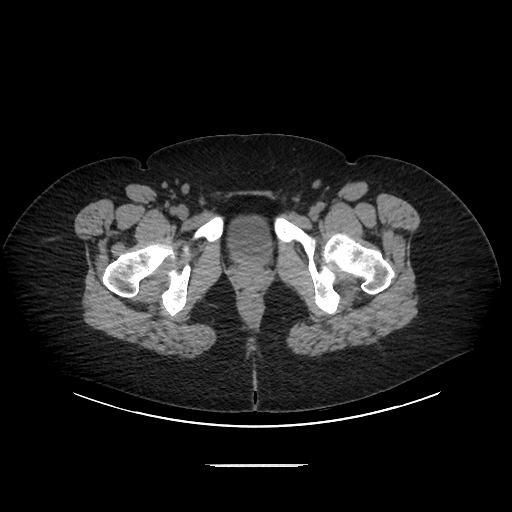
[im 39/201  soft-tissue]
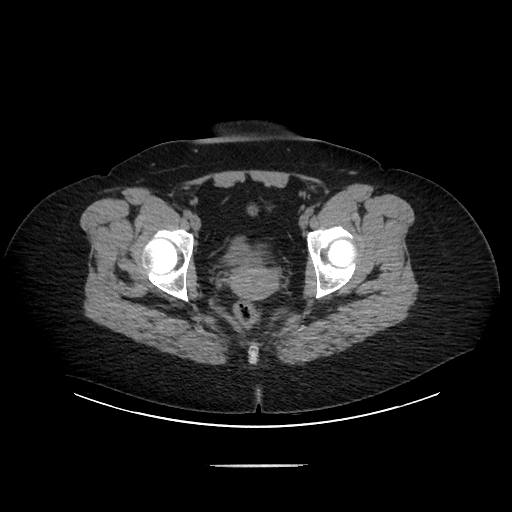
[im 52/201  soft-tissue]
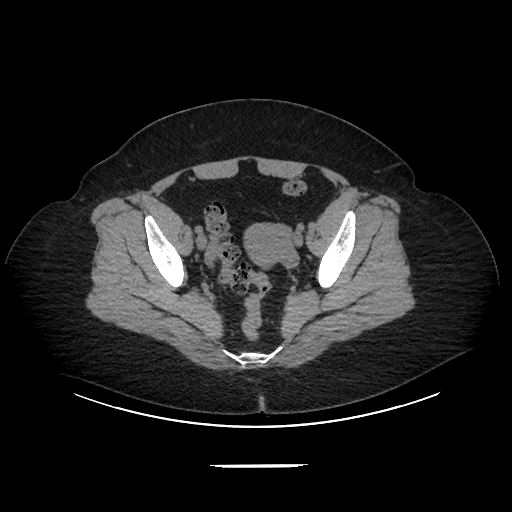
[im 65/201  soft-tissue]
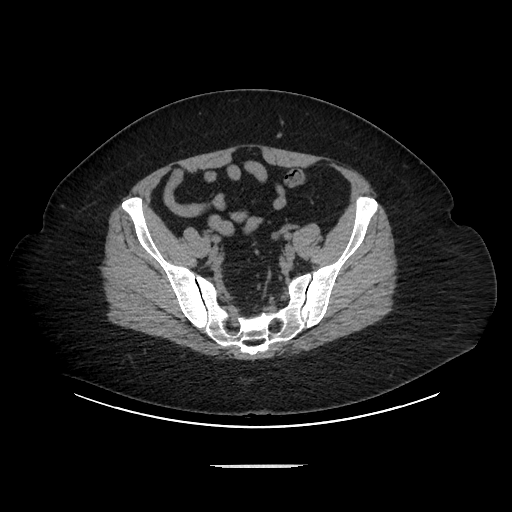
[im 78/201  soft-tissue]
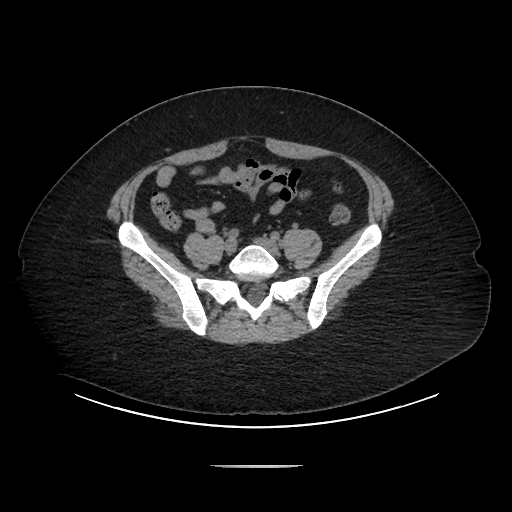
[im 91/201  soft-tissue]
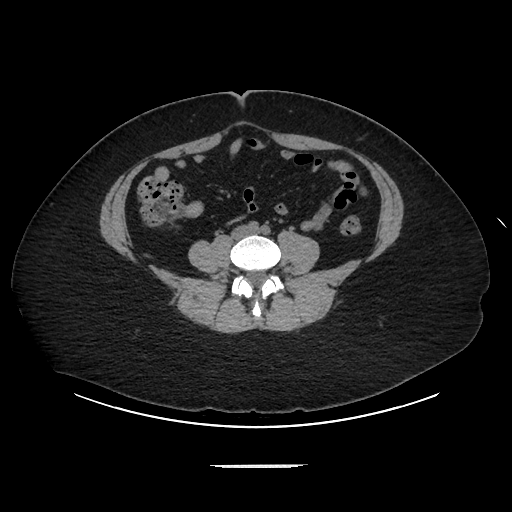
[im 110/201  soft-tissue]
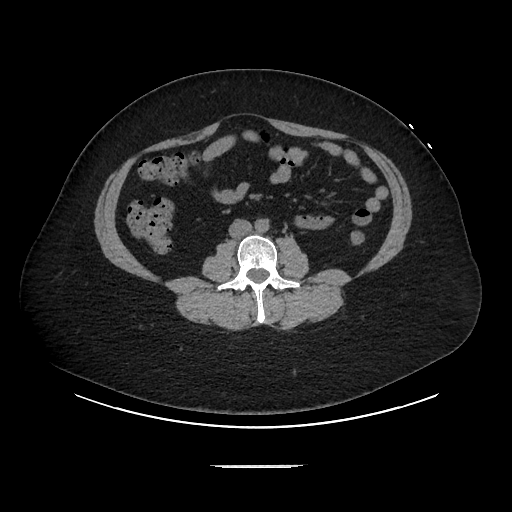
[im 123/201  soft-tissue]
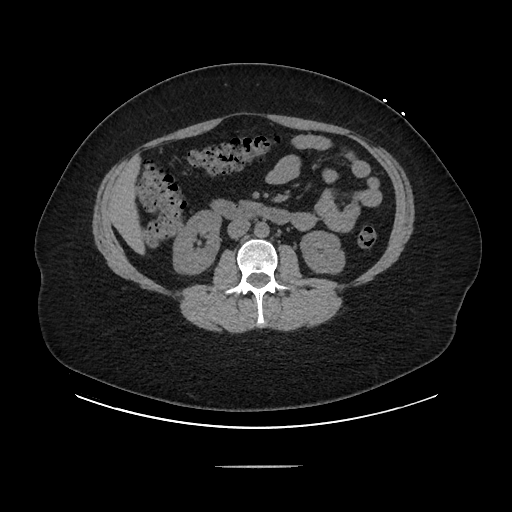
[im 123/201  bone]
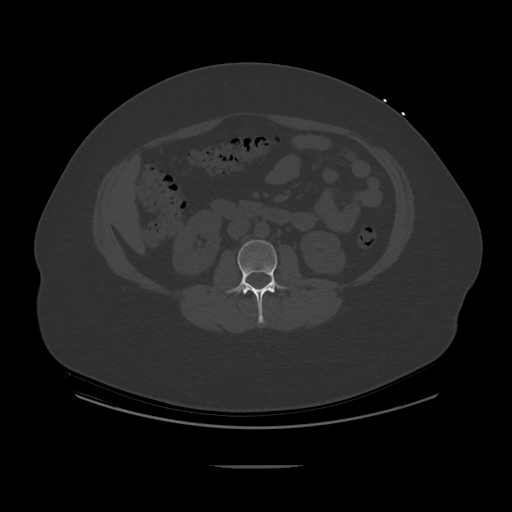
[im 136/201  soft-tissue]
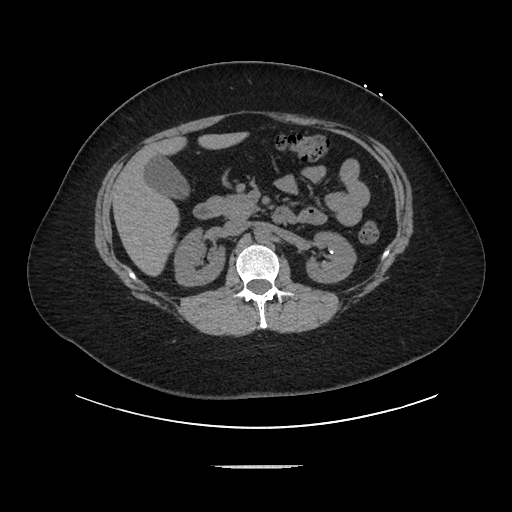
[im 149/201  soft-tissue]
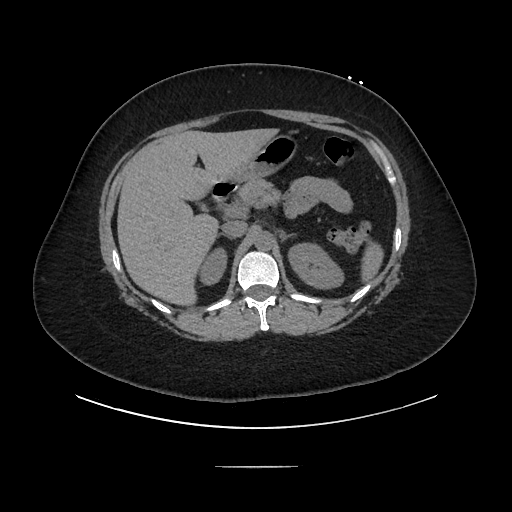
[im 162/201  soft-tissue]
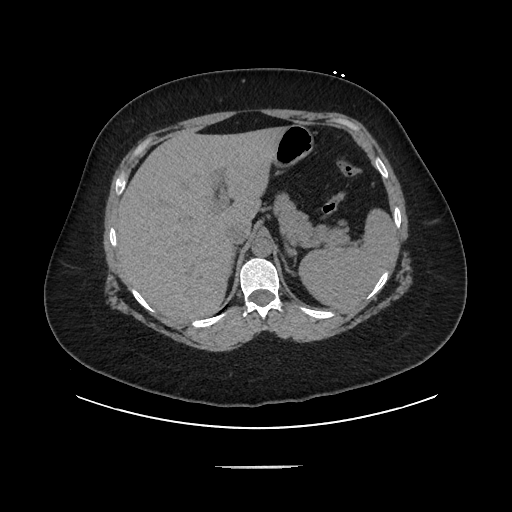
[im 175/201  soft-tissue]
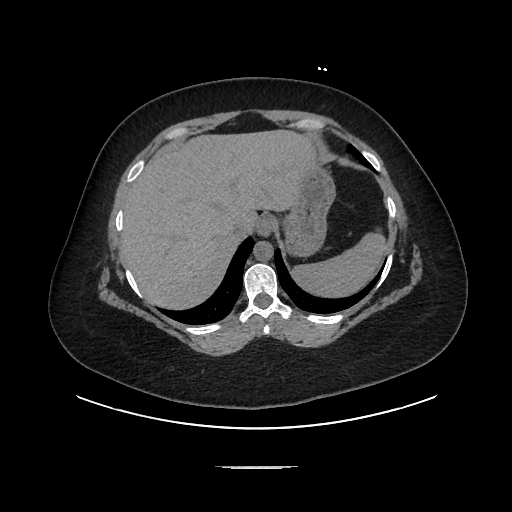
[im 188/201  soft-tissue]
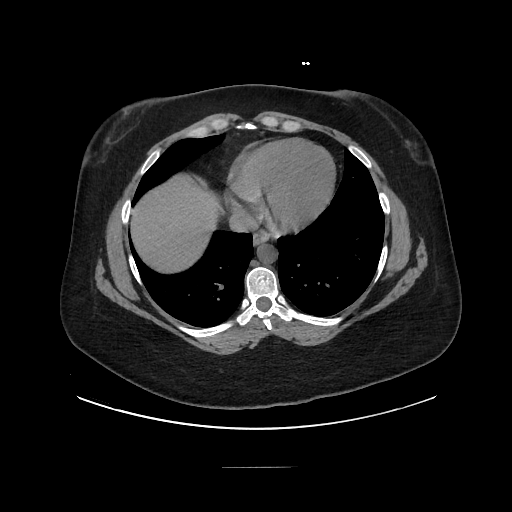

[Series 602: sag standard 2x2 · sagittal · 0.98mm/px · 3 of 184 slices shown]
[im 62/184  soft-tissue]
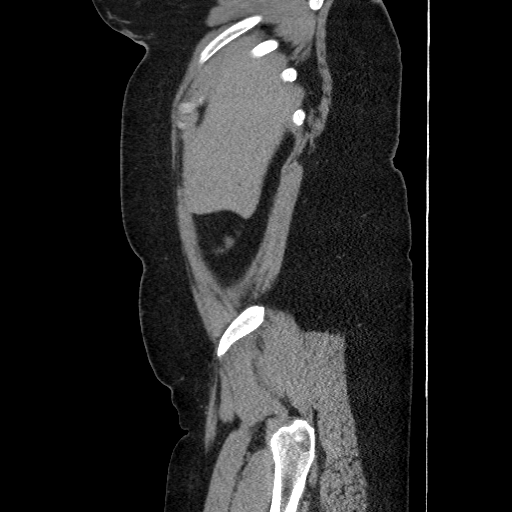
[im 82/184  soft-tissue]
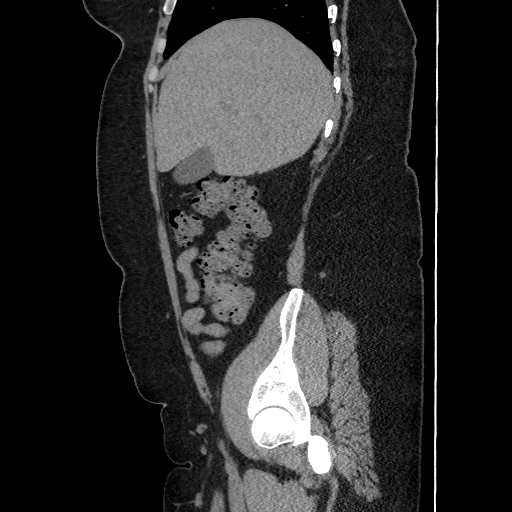
[im 102/184  soft-tissue]
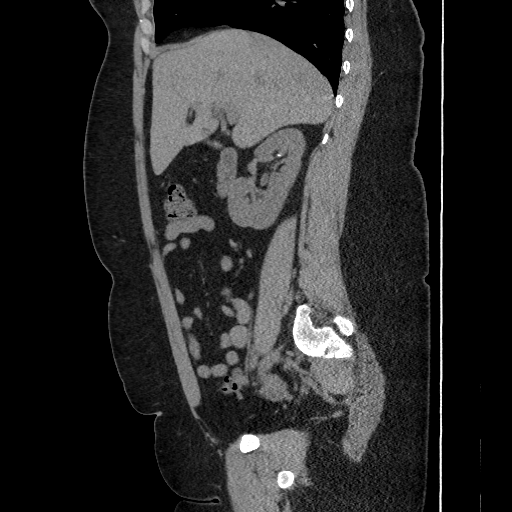

[17 of 46 positions shown; findings below may reference images not displayed]

FINDINGS: Minimal bibasilar atelectasis is noted. The liver, spleen, pancreas, and adrenal glands are grossly normal. Several small nonobstructing renal stones are present bilaterally measuring up to 4 mm in size. No ureteral stone is identified. There is no hydroureter or hydronephrosis.
The abdominal aorta is of normal caliber. The appendix is normal. The gallbladder is grossly normal. The urinary bladder is under distended but grossly normal.
There is no abdominal or pelvic lymphadenopathy or ascites. There is no evidence of free air or bowel obstruction. A small fat-containing umbilical hernia is present. No acute osseous abnormality is seen.
IMPRESSION: Bilateral nonobstructing renal stones.
Location:1
Is the patient pregnant?
No

## 2020-06-24 IMAGING — DX XR ABDOMEN 1 VIEW
1 series · 2 of 2 positions shown · non-contrast
Comparison: none

Flank pain, kidney stone suspected
EXAM:   ABDOMEN XRAY:
CLINICAL INDICATION: Calculus of kidney
Unspecified abdominal pain
REFERENCE: 10/28/2019

[Series 3382: AP · U · 0.19mm/px · 2 of 2 slices shown]
[im 1/2]
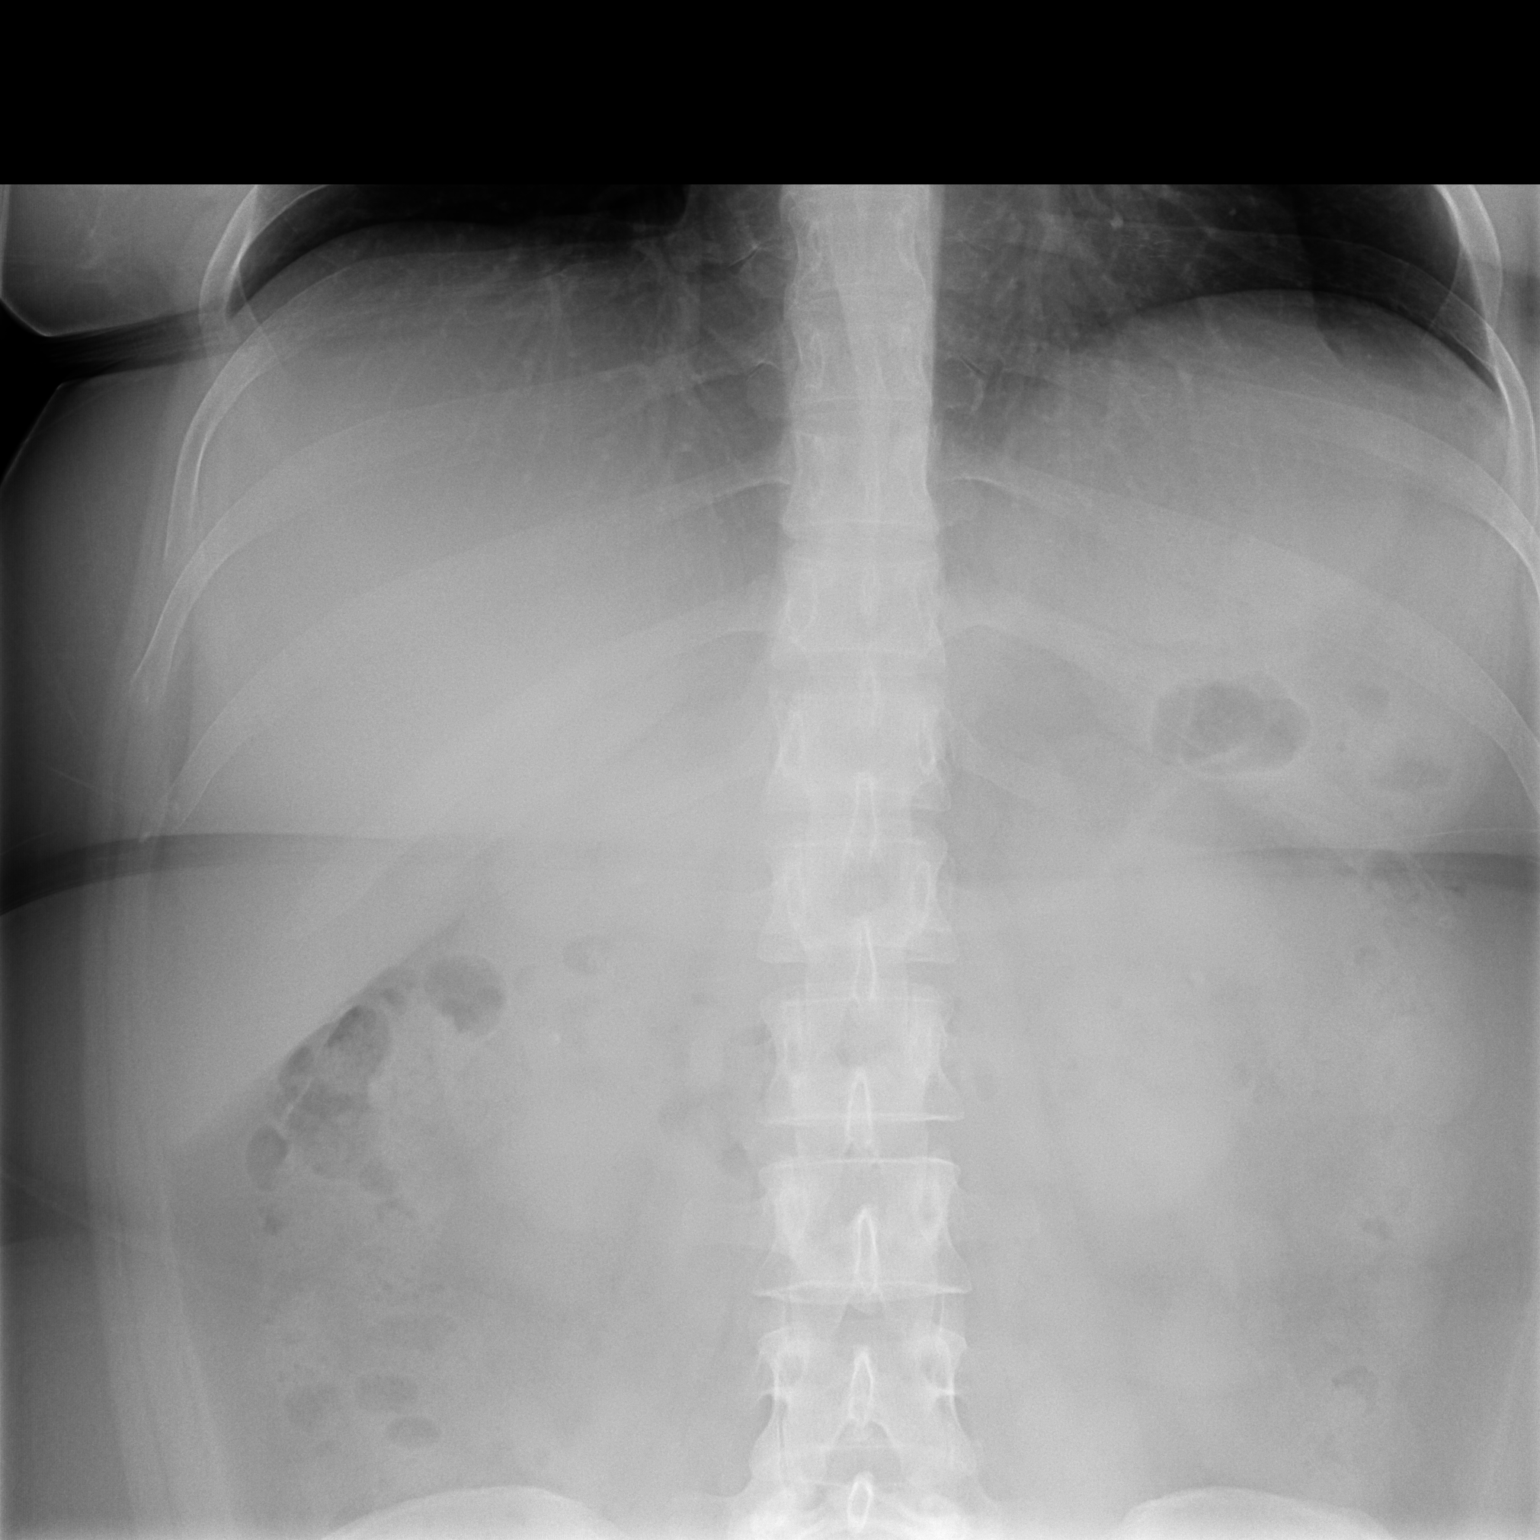
[im 2/2]
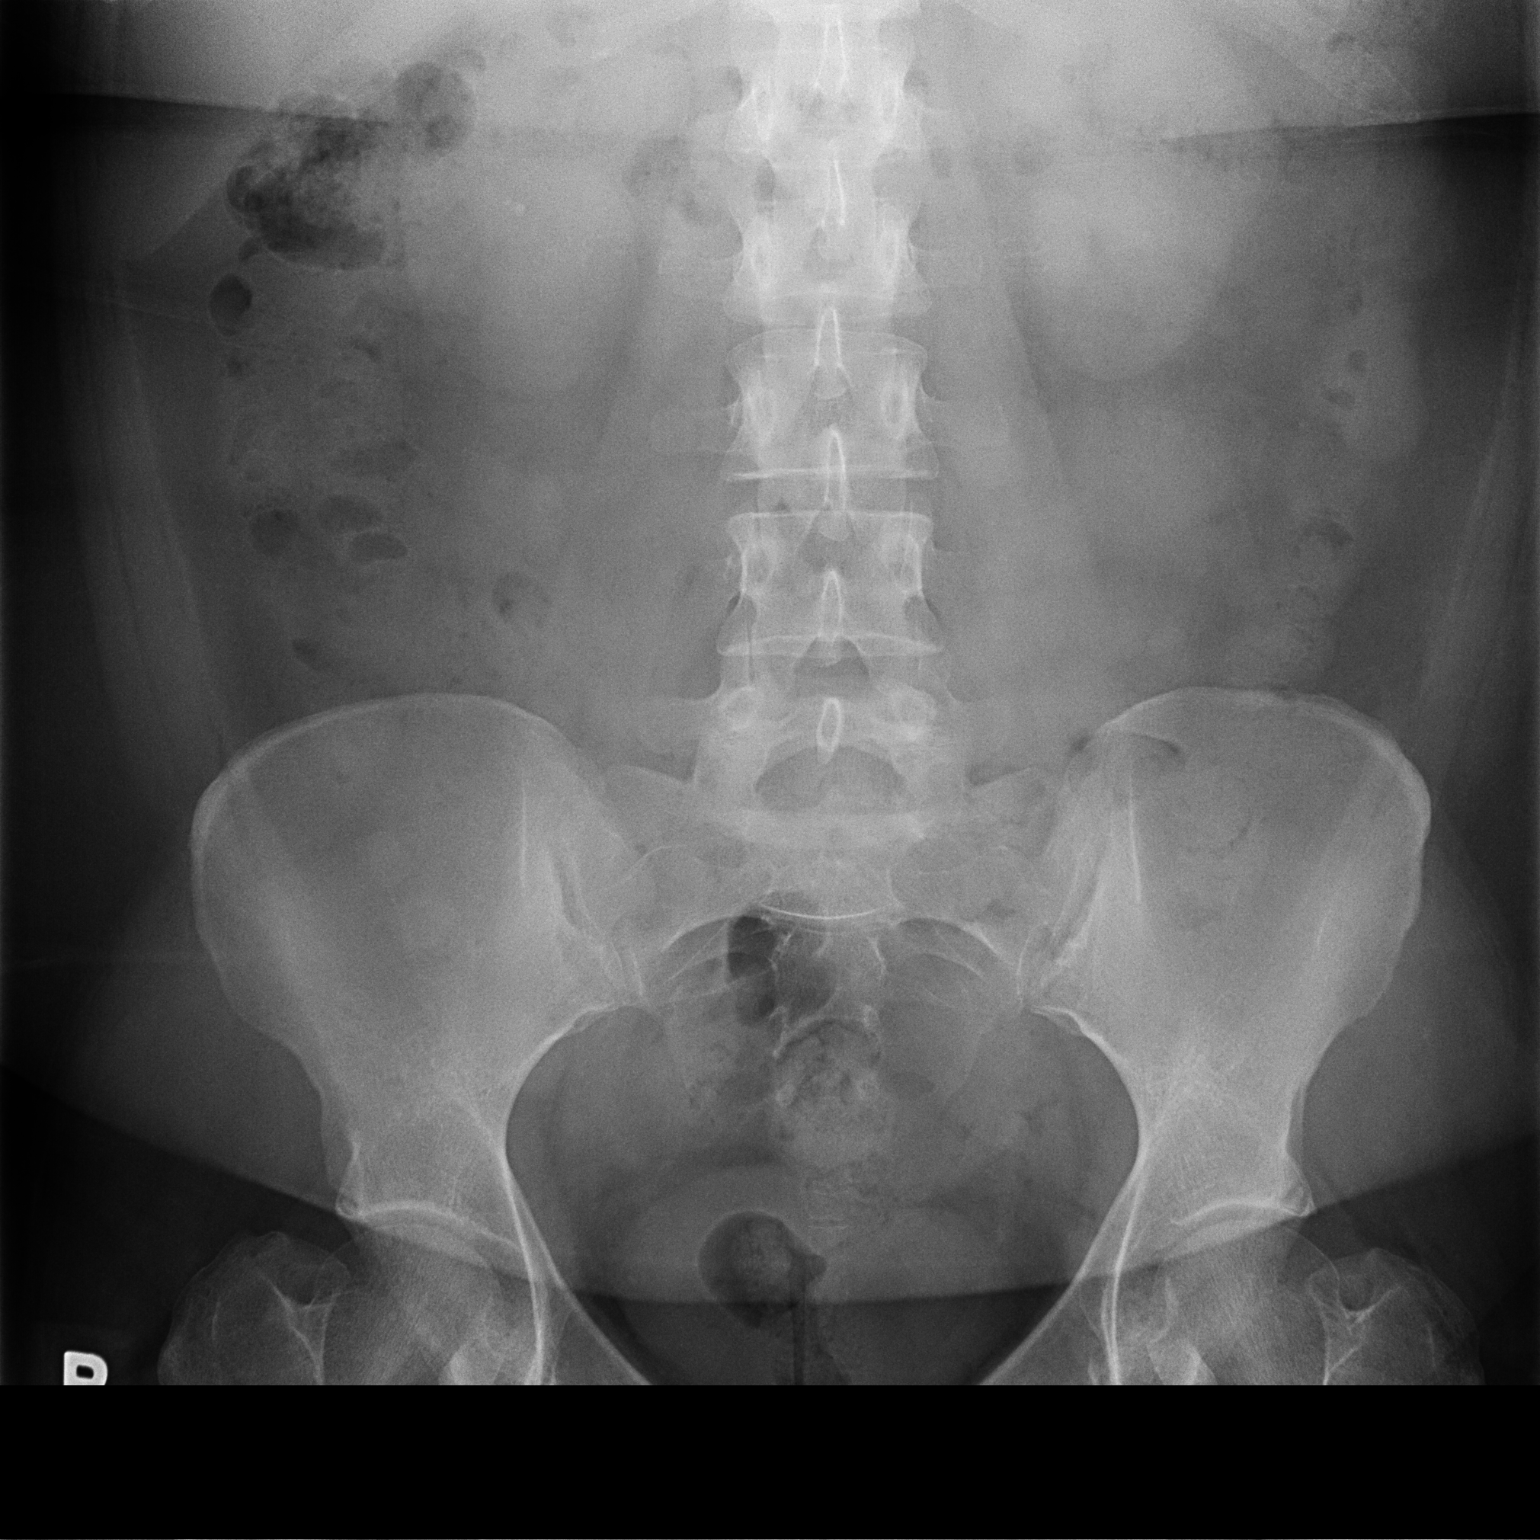

[2 of 2 positions shown; findings below may reference images not displayed]

FINDINGS: 2 images obtained.
Abdominal imaging demonstrates a nonobstructive, nonspecific bowel gas pattern.
Bilateral renal calculi. No definite ureteral calculus detected.
Moderate retained fecal material within the colon.
IMPRESSION: Bilateral renal calculi.
LOCATION CODE: 1
Is the patient pregnant?
Unknown

## 2020-07-13 IMAGING — CT CT ABDOMEN PELVIS WITHOUT CONTRAST - STONE PROTOCOL
2 of 3 series · 17 of 46 positions shown, 19 images · non-contrast
Comparison: [facility] from 06/24/2008. CT abdomen/pelvis from 11/02/2019

EXAM:  CT ABDOMEN \T\ PELVIS WITHOUT CONTRAST RENAL STONE PROTOCOL:
CLINICAL INDICATION: 35-year-old female with flank pain
TECHNIQUE: Axial images are obtained from the extreme lung bases through the upper thighs without the administration of IV contrast.

[Series 2: w/o · axial · non-contrast · 0.85mm/px · z∈[-549,-127]mm · 14 of 195 slices shown, 16 images]
[im 13/195  soft-tissue]
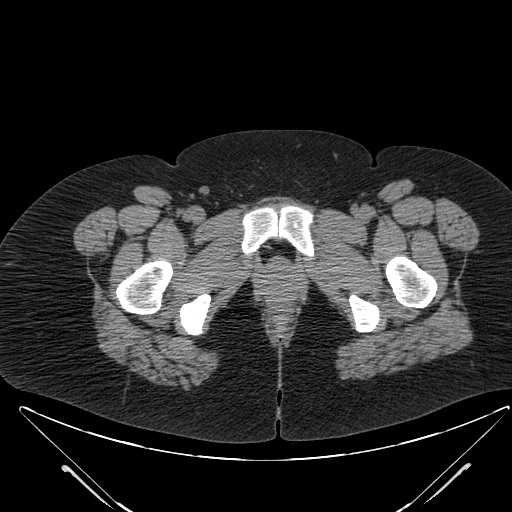
[im 13/195  bone]
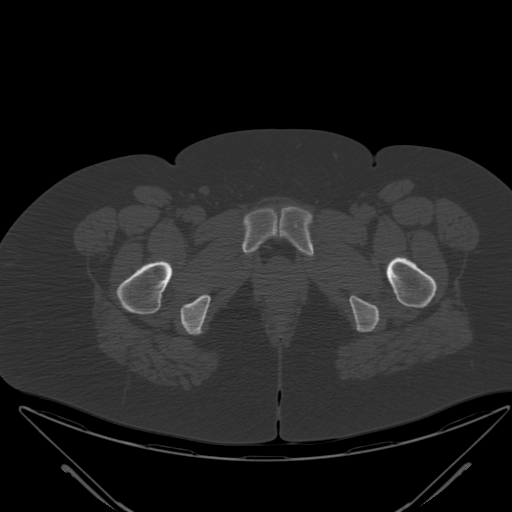
[im 26/195  soft-tissue]
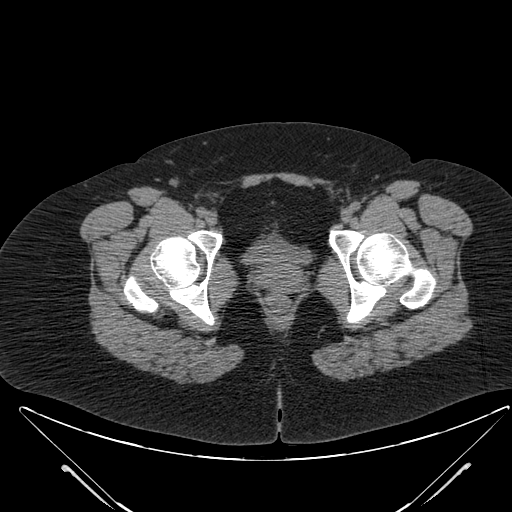
[im 38/195  soft-tissue]
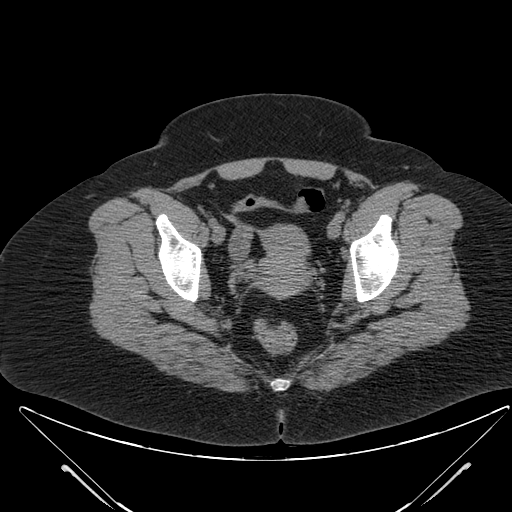
[im 51/195  soft-tissue]
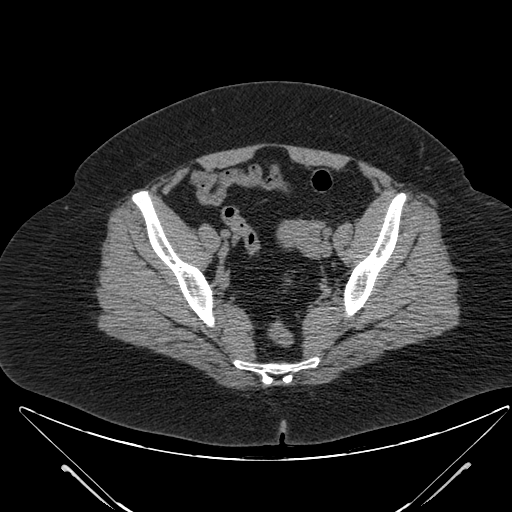
[im 63/195  soft-tissue]
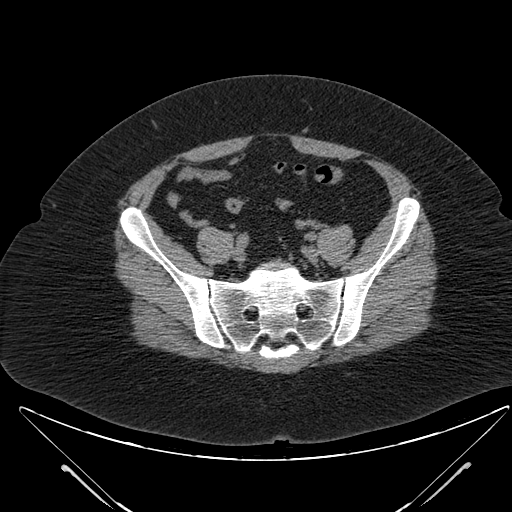
[im 76/195  soft-tissue]
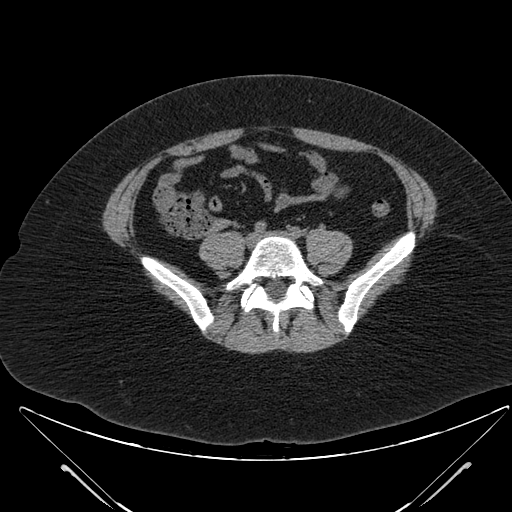
[im 88/195  soft-tissue]
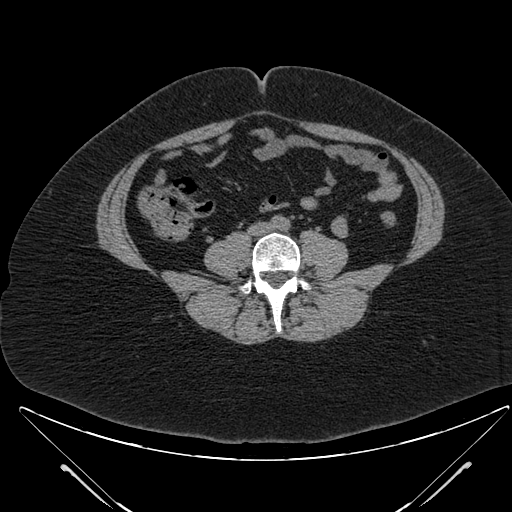
[im 107/195  soft-tissue]
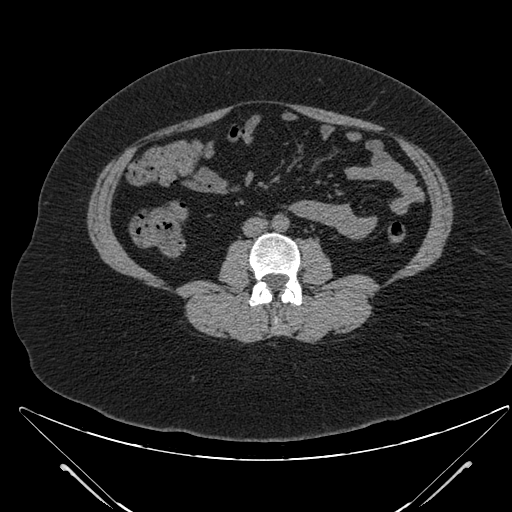
[im 119/195  soft-tissue]
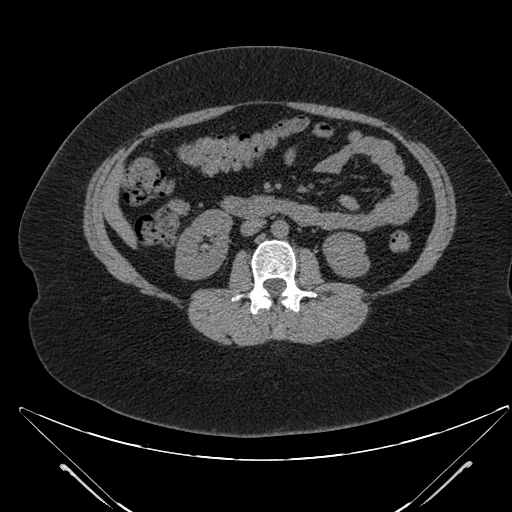
[im 119/195  bone]
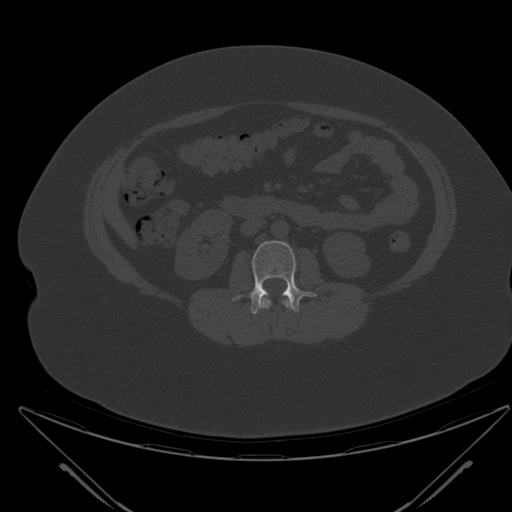
[im 132/195  soft-tissue]
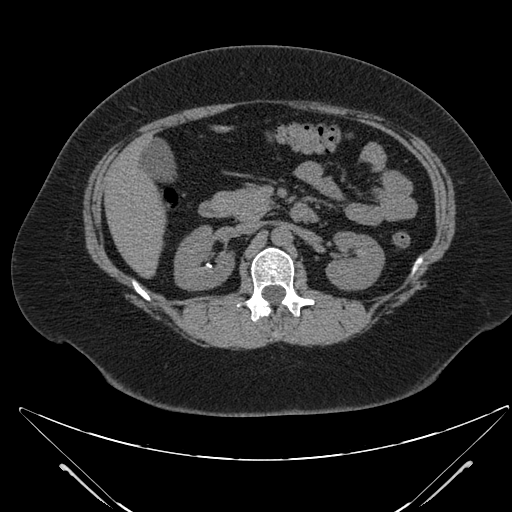
[im 144/195  soft-tissue]
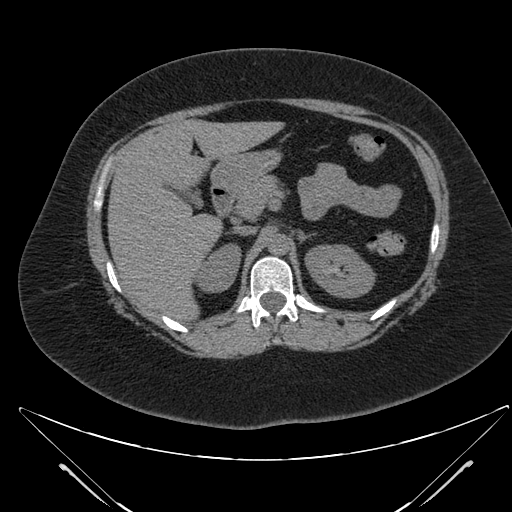
[im 157/195  soft-tissue]
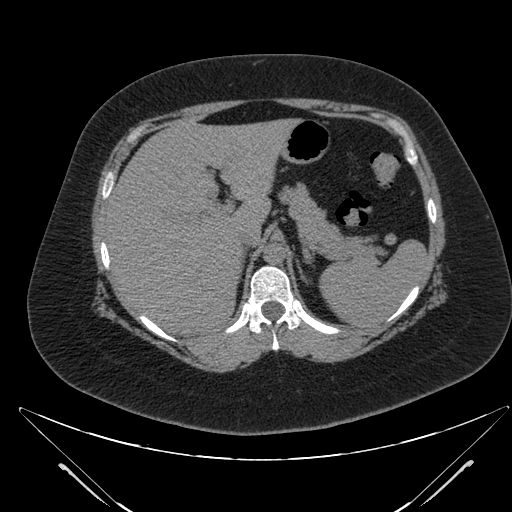
[im 169/195  soft-tissue]
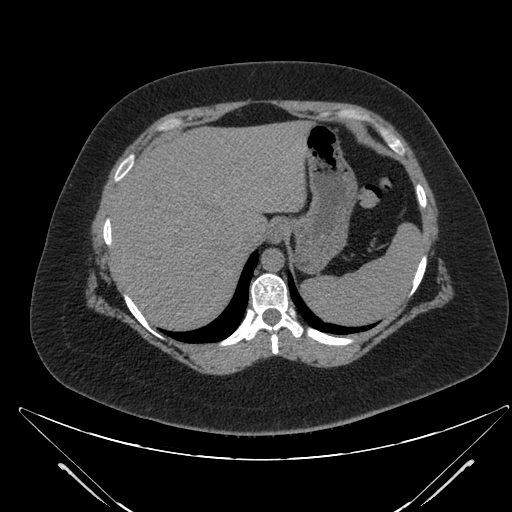
[im 182/195  soft-tissue]
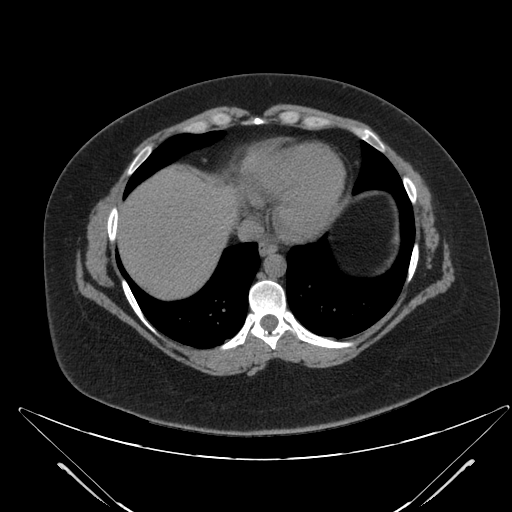

[mpr, coronal, coronal · coronal · 0.94mm/px · 3 of 158 slices shown]
[im 53/158  soft-tissue]
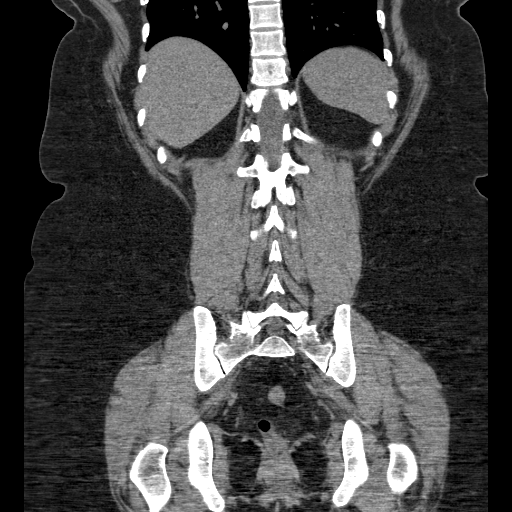
[im 70/158  soft-tissue]
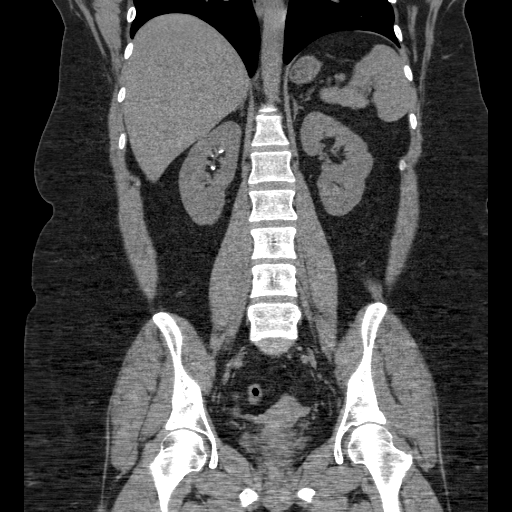
[im 88/158  soft-tissue]
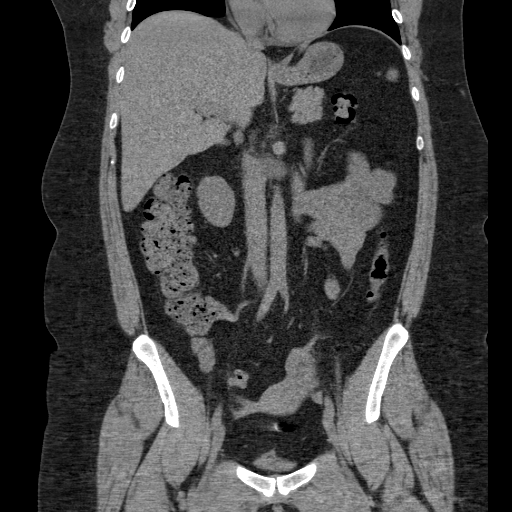

[17 of 46 positions shown; findings below may reference images not displayed]

FINDINGS: LUNG BASES: Clear.
LIVER: No mass.  No intrahepatic biliary dilatation.
GALLBLADDER: No wall thickening.  No stones.
COMMON BILE DUCT: Normal caliber.  No stones.
SPLEEN: Within normal limits.
PANCREAS: No mass.  No pancreatic fluid collections.
ADRENALS: No masses.
KIDNEYS: No masses.  No hydronephrosis. Stable, nonobstructing bilateral renal calyceal calculi measuring up to 5 mm at the right lower pole.
LYMPH NODES: No adenopathy.
STOMACH, SMALL BOWEL AND COLON: No bowel wall thickening or obstruction. Normal appendix.
PERITONEAL CAVITY: No mesenteric stranding or free fluid.
OSSEOUS STRUCTURES: No acute fracture or destructive lesion.
ABDOMINAL AORTA: No aneurysm.
PELVIC ORGANS: Urinary bladder is decompressed. Uterus is unremarkable. No adnexal mass or fluid collection
SOFT TISSUE: Minimal, fat-containing umbilical hernia
IMPRESSION: 1.  No acute findings.
2.  Stable, nonobstructing bilateral renal calyceal calculi measuring up to 5 mm at the right lower pole.
All CT scans at this facility use dose modulation, iterative reconstruction, and/or weight-based dosing when appropriate to reduce radiation dose to as low as reasonably achievable.
LOCATION CODE: 13
Is the patient pregnant?
No

## 2020-10-11 IMAGING — US US PELVIS TRANSVAGINAL
1 series · 14 of 25 positions shown · non-contrast
Comparison: None.

FINAL REPORT:
US PELVIS TRANSVAGINAL
INDICATION: menorrhagia.
TECHNIQUE: Transvaginal scanning was used to evaluate the pelvis utilizing grayscale and color Doppler.

[Series 1: us pelvis transvaginal · 14 of 140 slices shown]
[im 1/140]
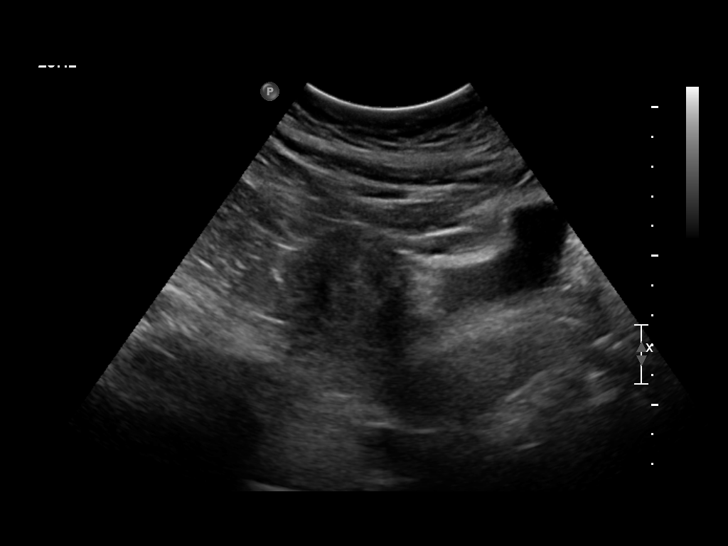
[im 12/140]
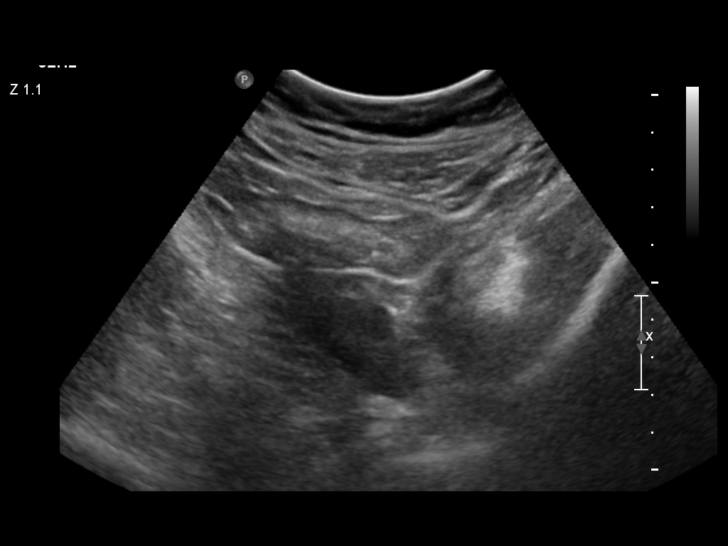
[im 24/140]
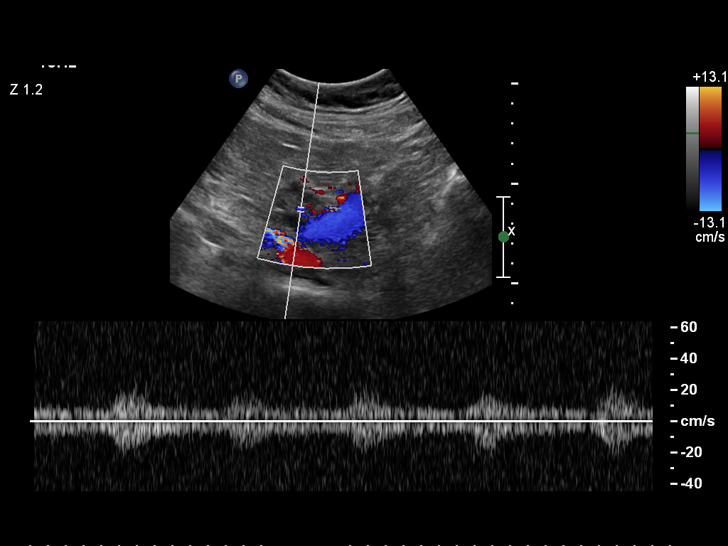
[im 35/140]
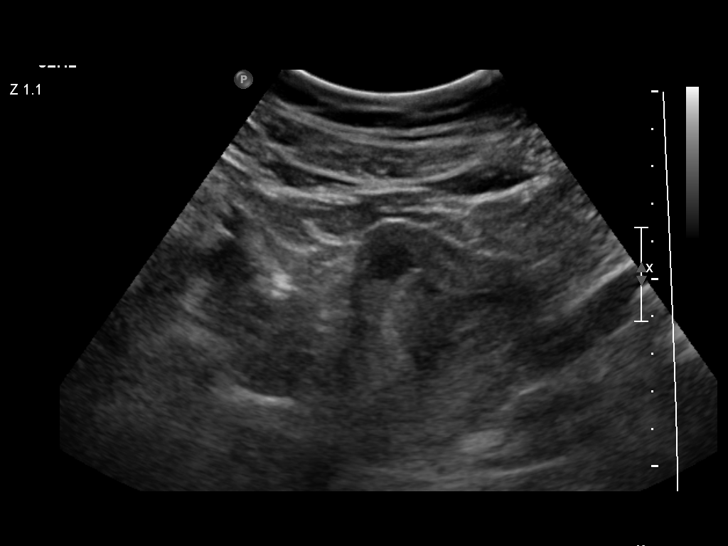
[im 47/140]
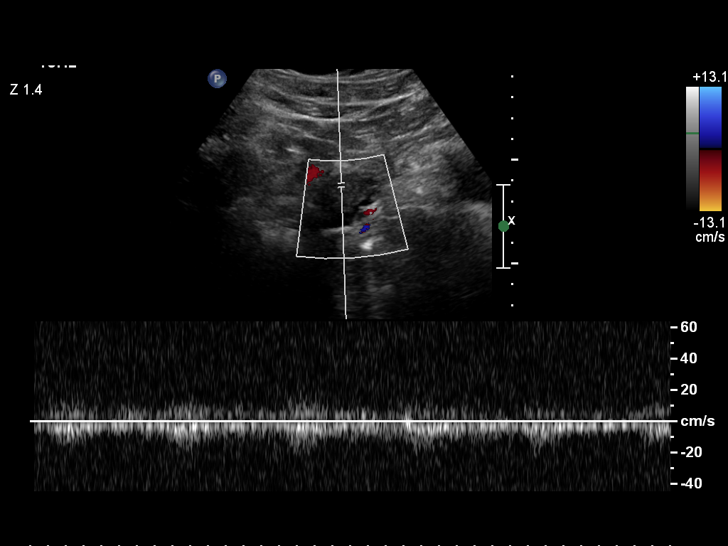
[im 53/140]
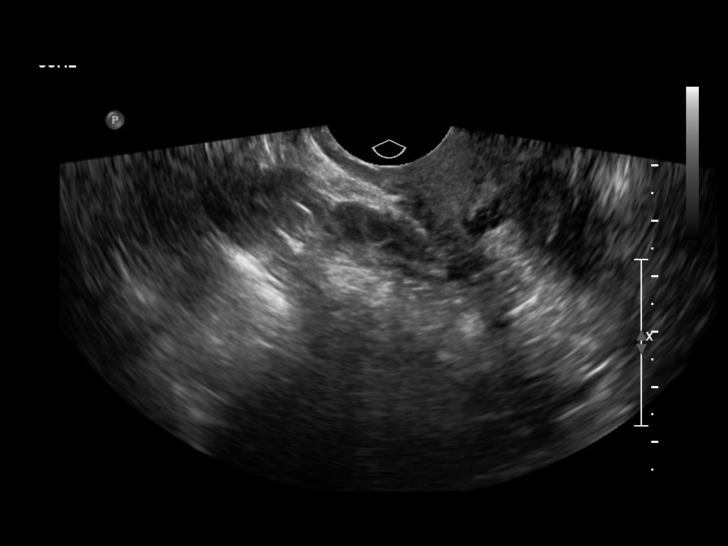
[im 64/140]
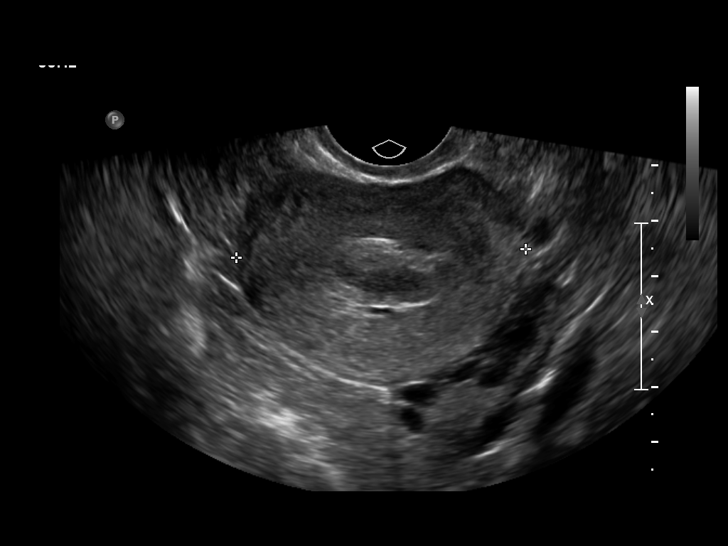
[im 76/140]
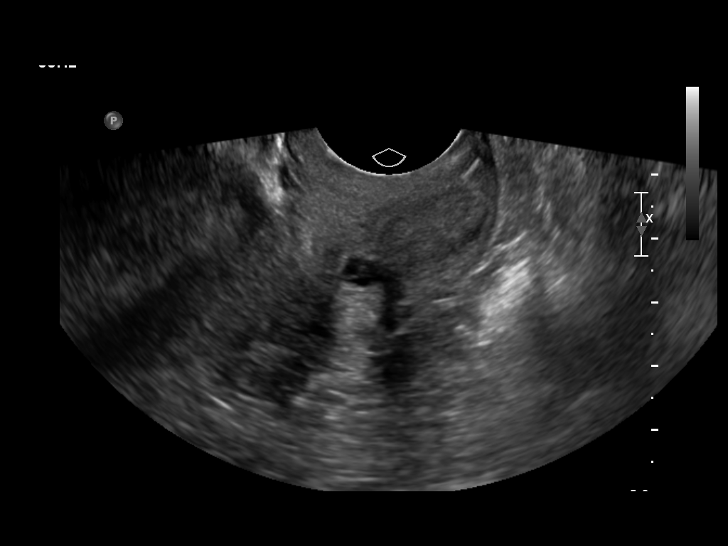
[im 87/140]
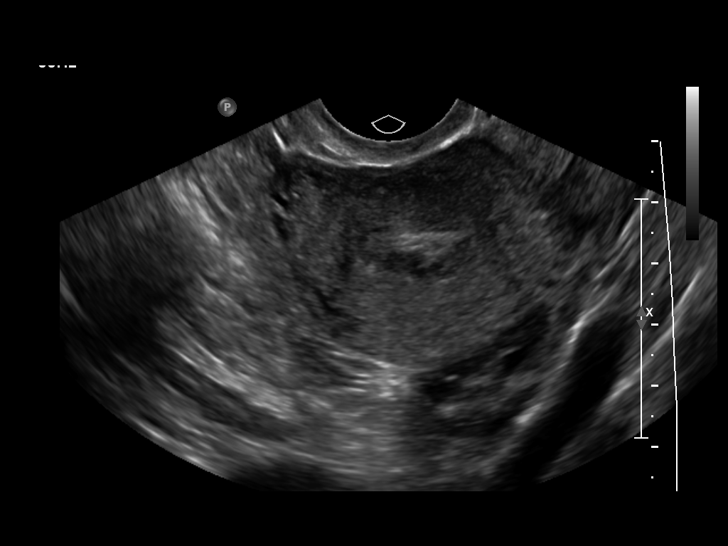
[im 93/140]
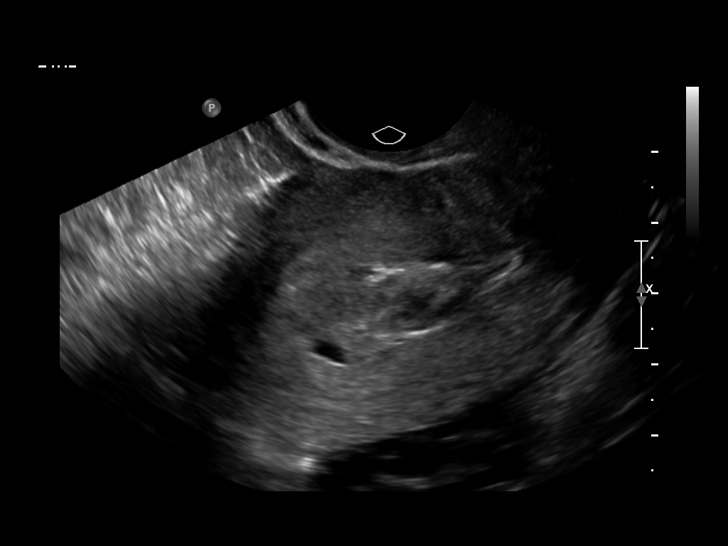
[im 105/140]
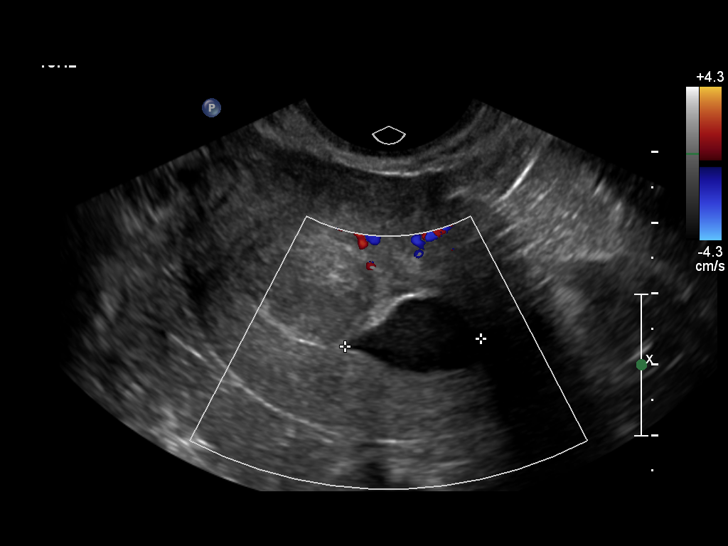
[im 116/140]
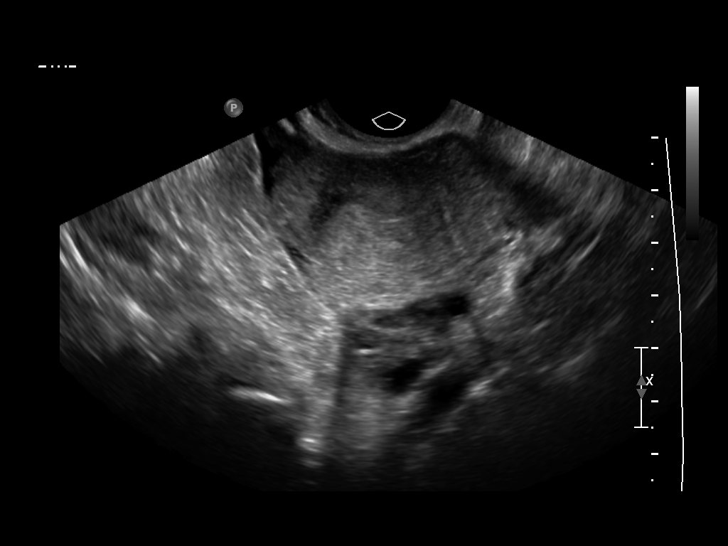
[im 128/140]
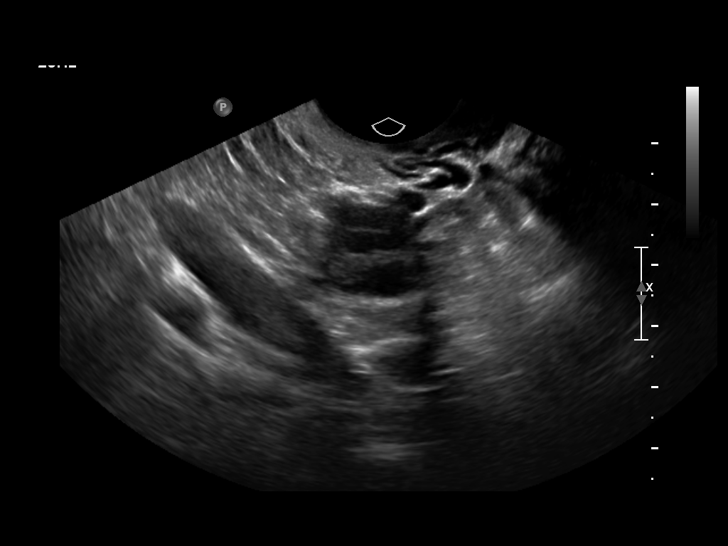
[im 140/140]
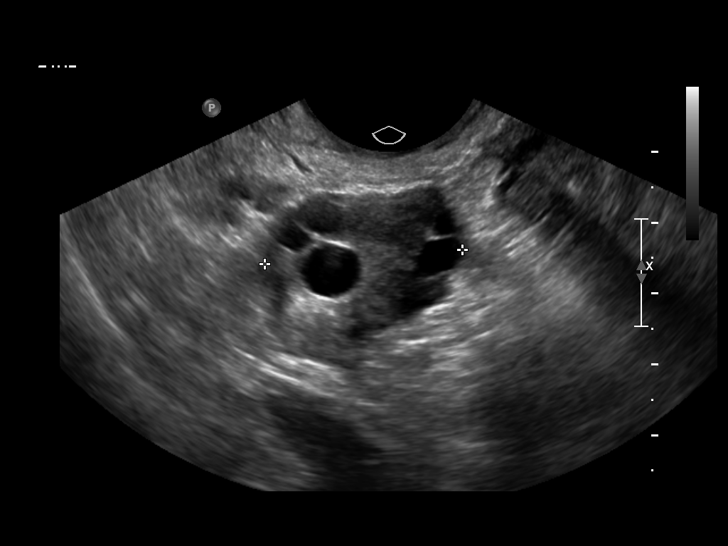

[14 of 25 positions shown; findings below may reference images not displayed]

FINDINGS: The uterus measures 8.2 x 3.9 x 5.2 cm. Cervical nabothian cysts are seen.
The endometrial echo complex measures 1.2 mm in thickness. There is a 2.8 x 1.2 x 2.1 cm isoechoic/hyperechoic avascular mass like structure in the endometrial cavity. Adjacent to this is a 1.1 x 0.9 x 1.9 cm fluid loculation within the endometrial cavity.
The right ovary measures 2.4 x 2.3 x 2.8 cm. The left ovary measures 5.0 x 1.6 x 2.3 cm. Spectral Doppler analysis was also performed demonstrating arterial and venous waveforms documented within the ovaries.
There are no solid adnexal masses.
There is no significant pelvic free fluid.
IMPRESSION: 1.  2.8 cm isoechoic/hyperechoic structure in the endometrial cavity may represent a clot/blood products given provided history of menorrhagia.
2.  1.9 cm fluid loculation within the endometrial cavity, nonspecific.
3.  No evidence of ovarian torsion.

## 2020-11-15 IMAGING — US US PELVIS LIMITED
1 series · 14 of 25 positions shown · non-contrast
Comparison: none

[Series 1: us pelvis limited · 14 of 27 slices shown]
[im 1/27]
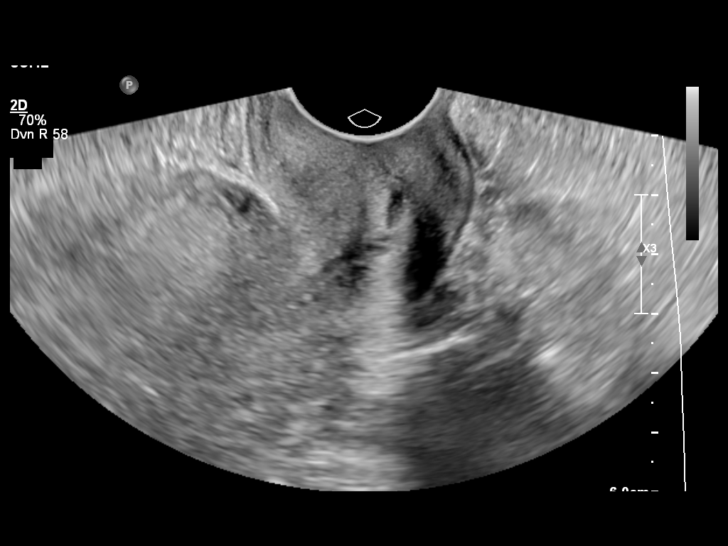
[im 3/27]
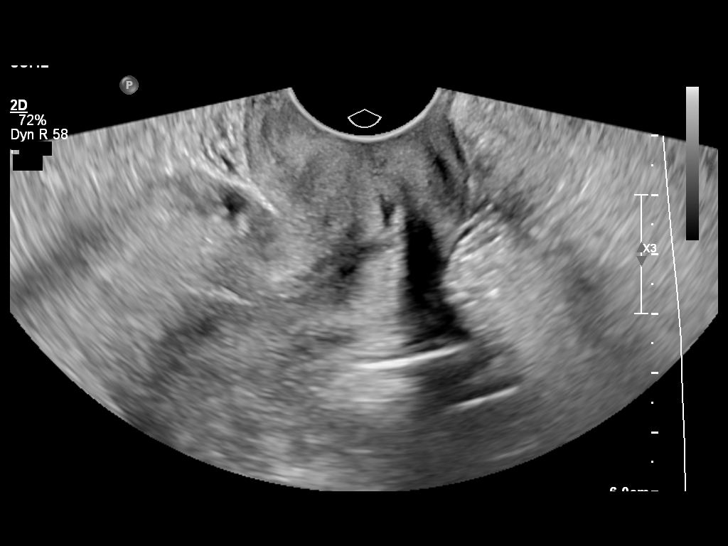
[im 5/27]
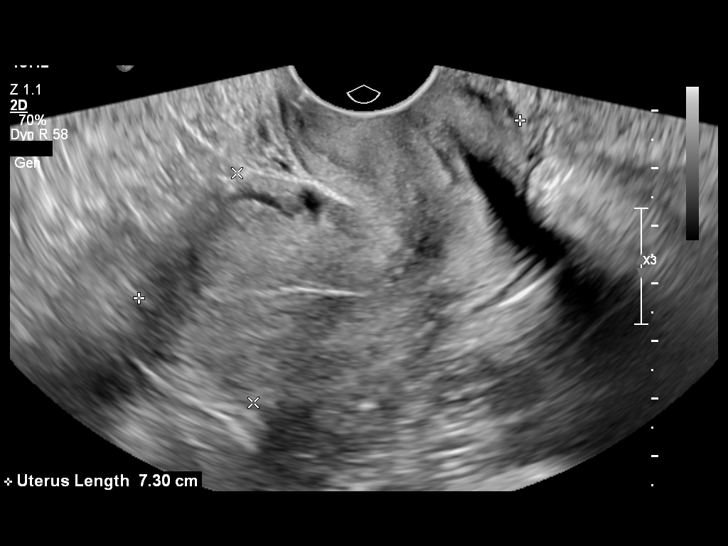
[im 7/27]
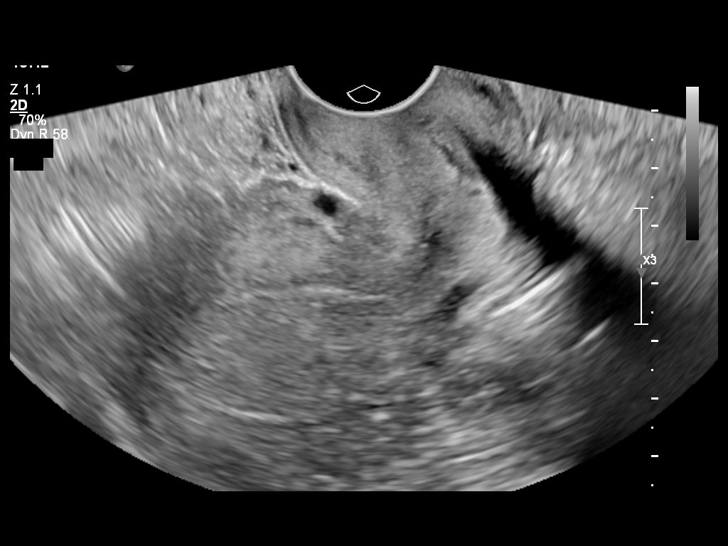
[im 9/27]
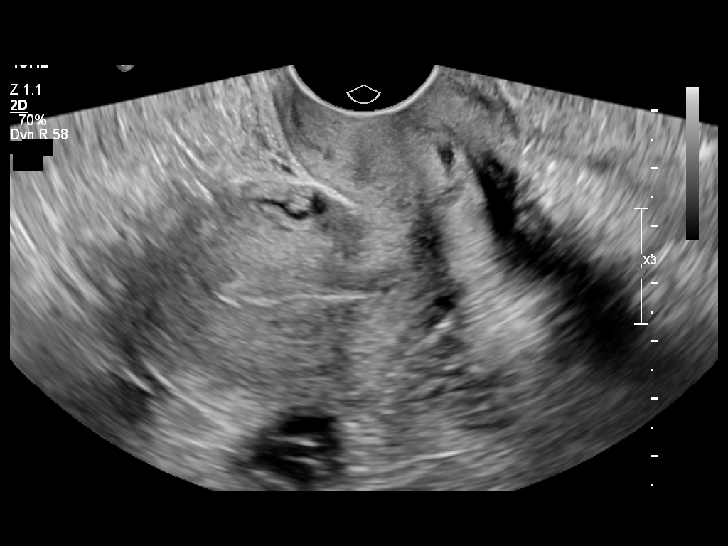
[im 10/27]
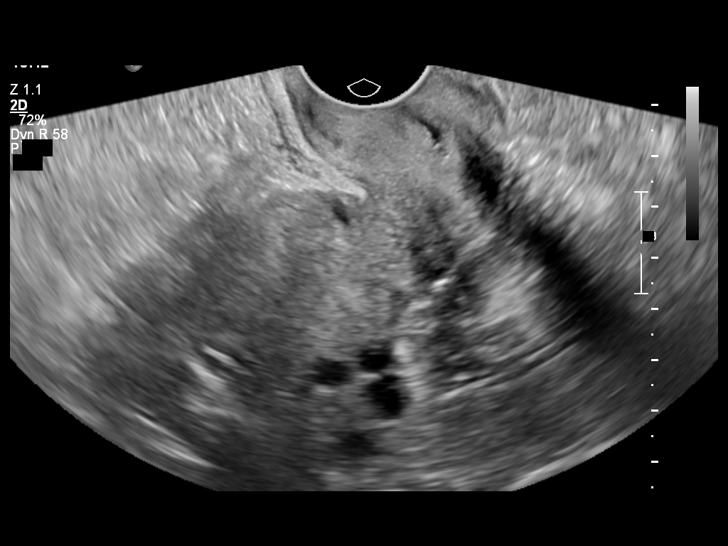
[im 12/27]
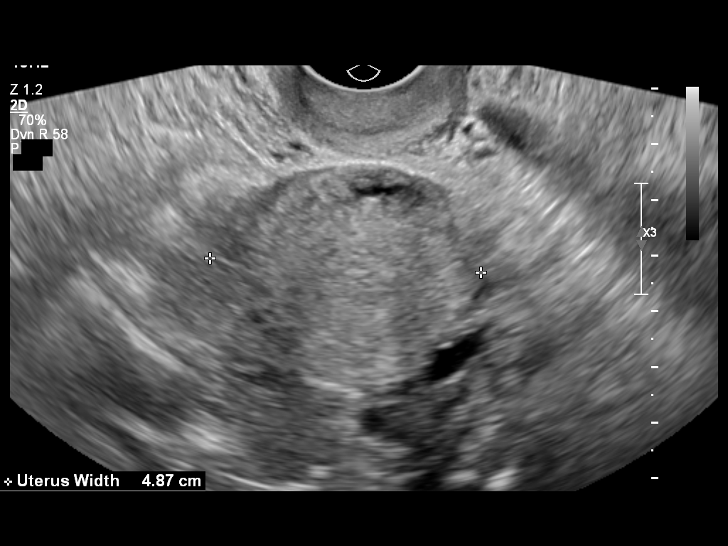
[im 15/27]
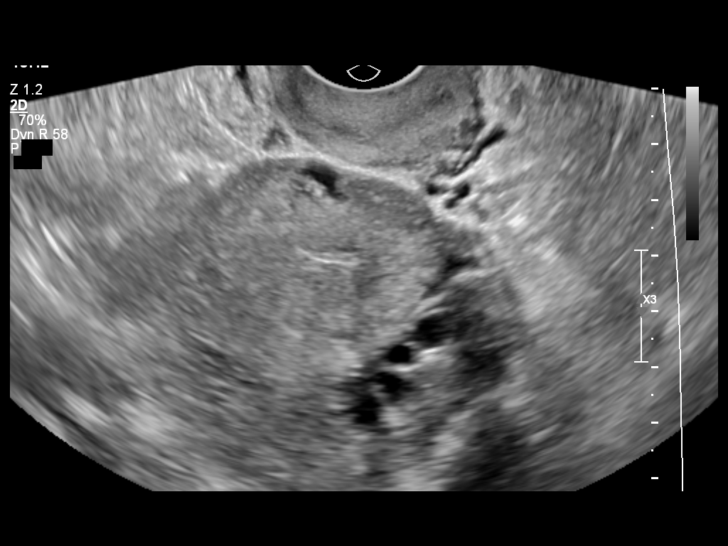
[im 17/27]
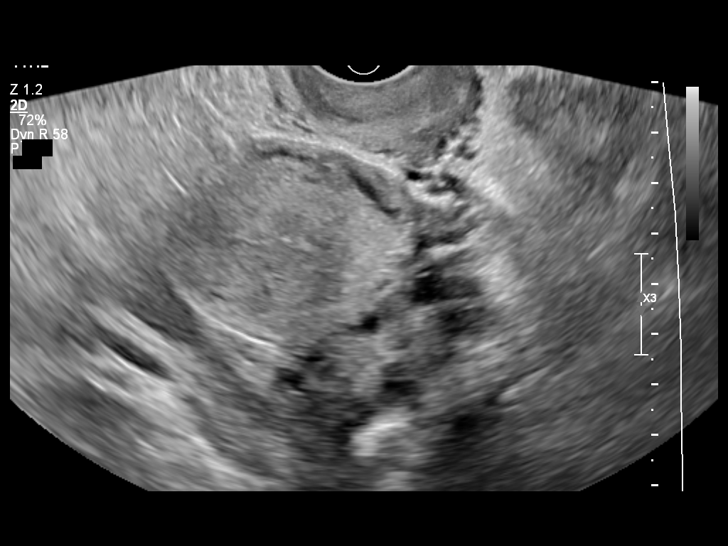
[im 18/27]
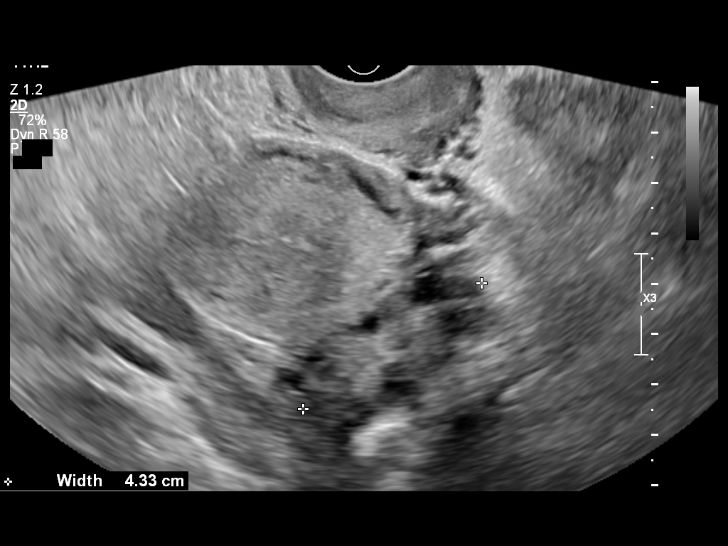
[im 20/27]
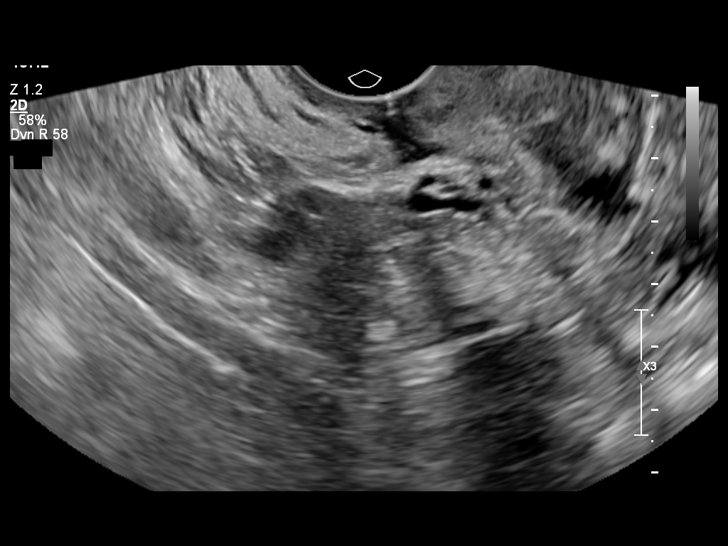
[im 22/27]
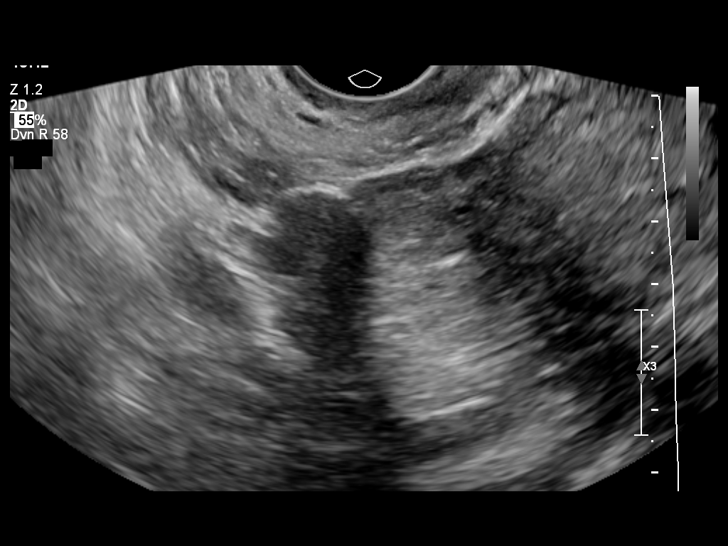
[im 24/27]
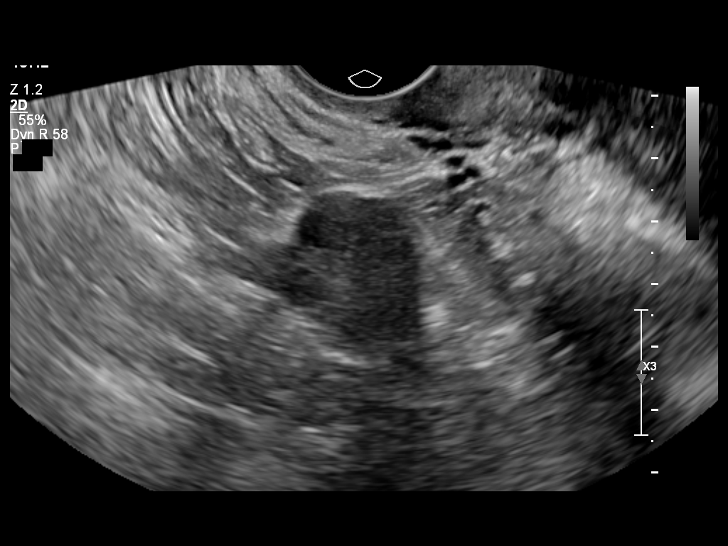
[im 27/27]
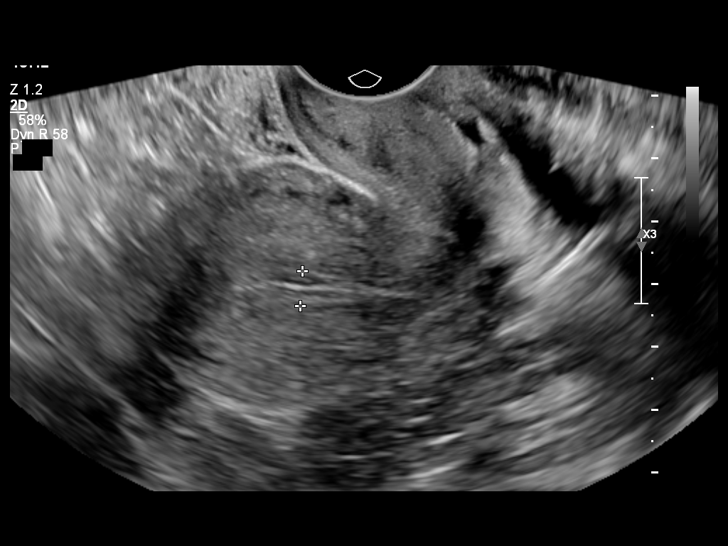

[14 of 25 positions shown; findings below may reference images not displayed]

This is a summary report. The complete report is available in the patient's medical record. If you cannot access the medical record, please contact the sending organization for a detailed fax or copy.
Follow up abnormal bleeding, pain.
Uterus appears anteverted with a volume of 75 ml
Endometrium appears normal - measuring 0.2-0.6 cm.
No evidence of mass or clot seen on last exam
Right ovary appears normal size with 14 follicles
Left ovary appears enlarged and contains over 20 countable follicles, concerning for PCOS
No fluid seen in the pelvis

## 2020-11-19 IMAGING — CT CT ABDOMEN PELVIS WITHOUT CONTRAST
2 of 4 series · 17 of 46 positions shown, 19 images · non-contrast
Comparison: none

R sided pain , hx kidney stones
Neg preg test on 11/20/2020
FINAL REPORT:
CT scan of the abdomen and pelvis [DATE], 2022 2082 hours
CLINICAL HISTORY: Flank pain, kidney stone suspected
TECHNIQUE: Helical axial sections with sagittal and coronal reformats of the abdomen and pelvis were obtained without intravenous contrast. Iterative reconstruction technique was employed to reduce patient radiation exposure.
Radiation Dose: Total exam DLP 9967 mGy/cm.
Reference is made to the prior report dated July 13, 2020.

[Series 3: thins · axial · 0.91mm/px · z∈[-478,-17]mm · 14 of 804 slices shown, 16 images]
[im 33/804  soft-tissue]
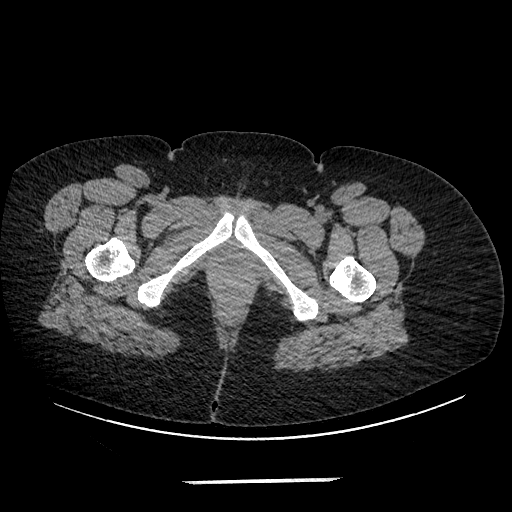
[im 33/804  bone]
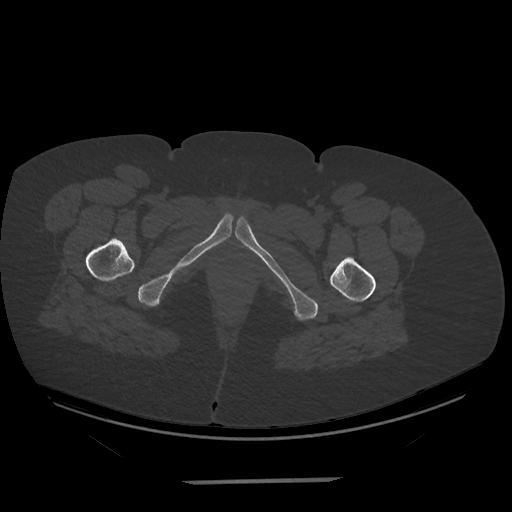
[im 97/804  soft-tissue]
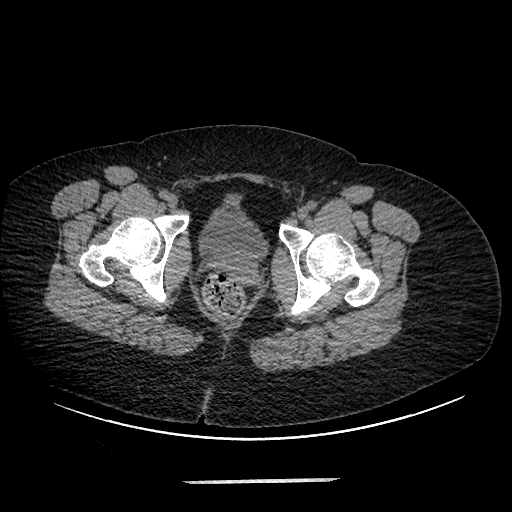
[im 161/804  soft-tissue]
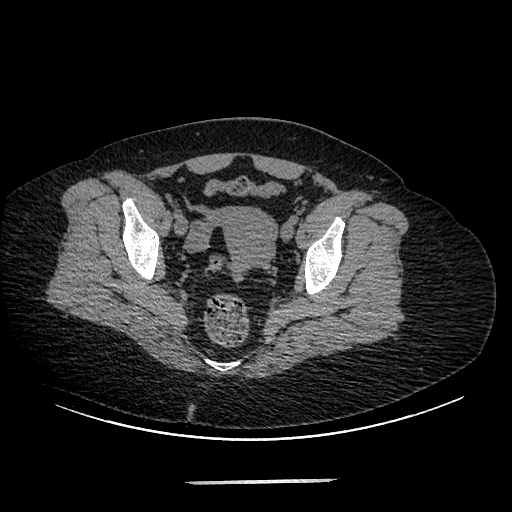
[im 225/804  soft-tissue]
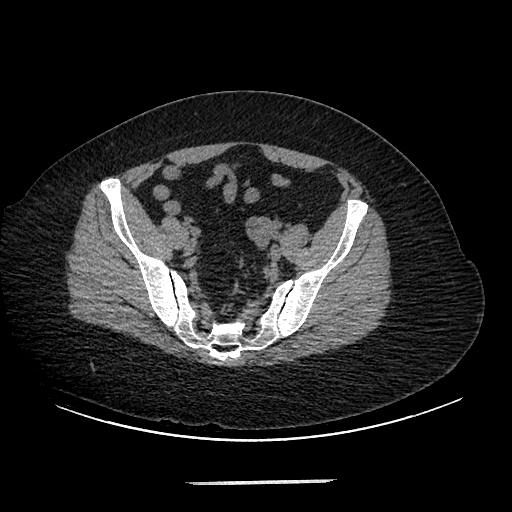
[im 257/804  soft-tissue]
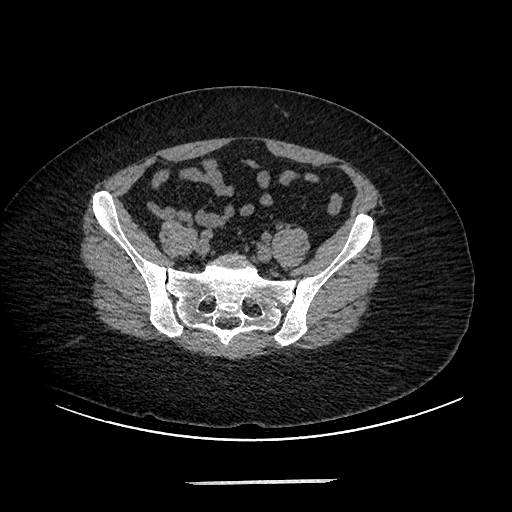
[im 322/804  soft-tissue]
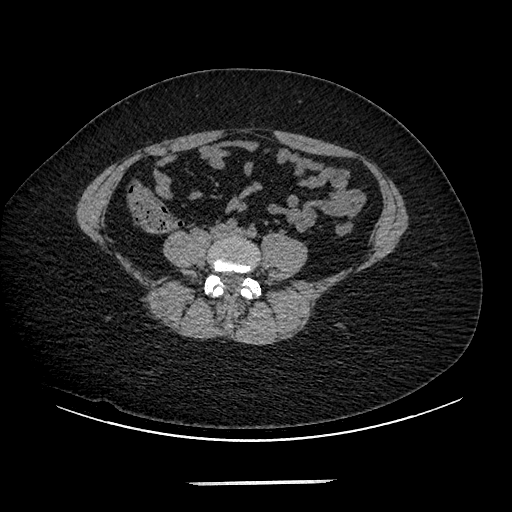
[im 386/804  soft-tissue]
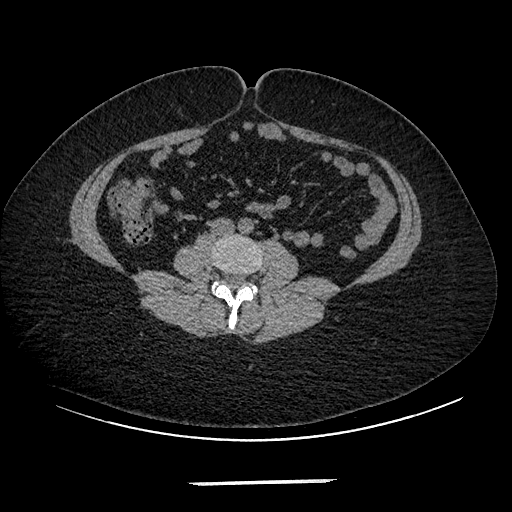
[im 418/804  soft-tissue]
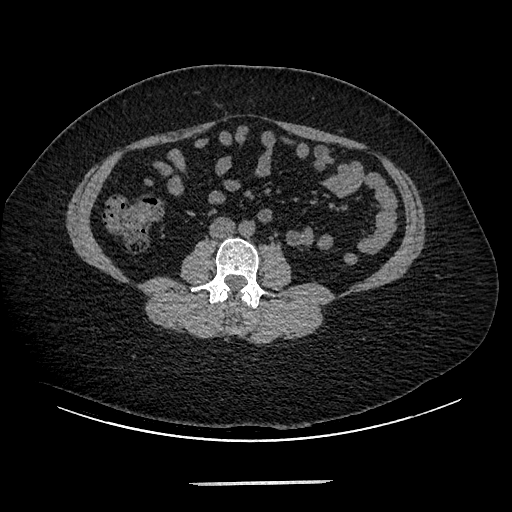
[im 482/804  soft-tissue]
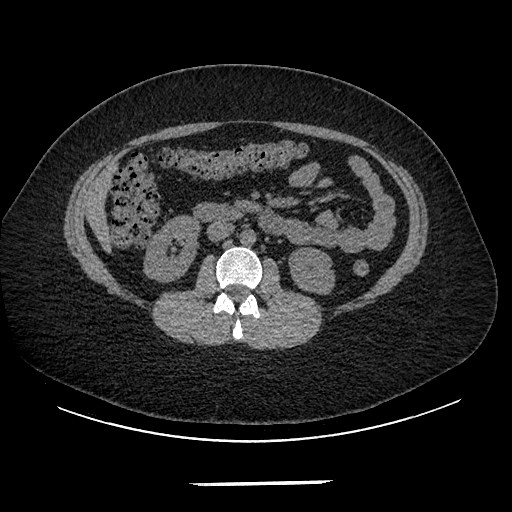
[im 482/804  bone]
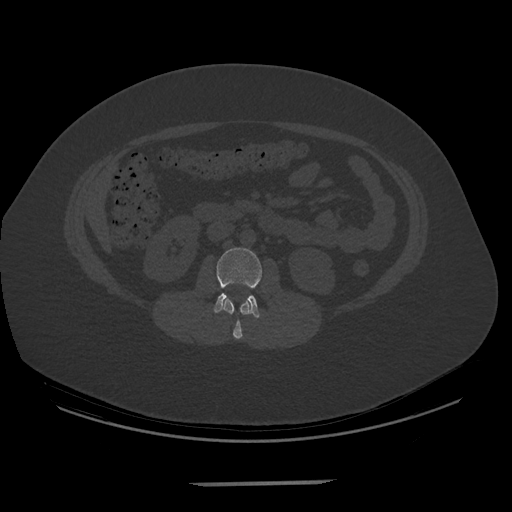
[im 547/804  soft-tissue]
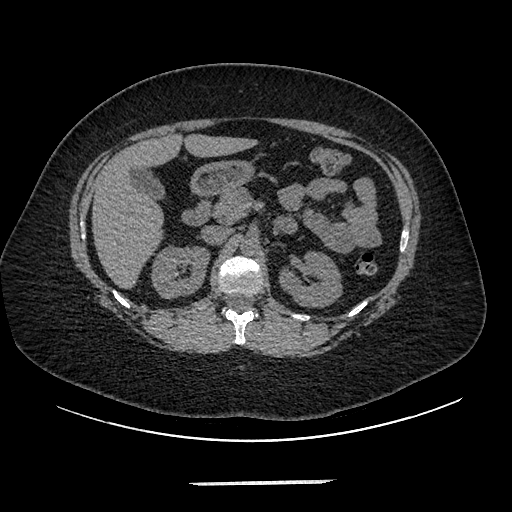
[im 611/804  soft-tissue]
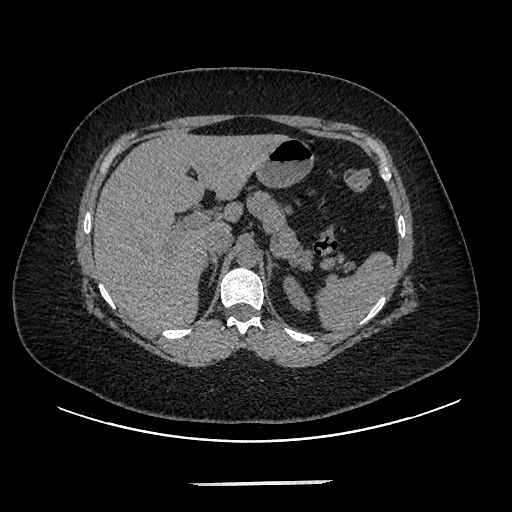
[im 643/804  soft-tissue]
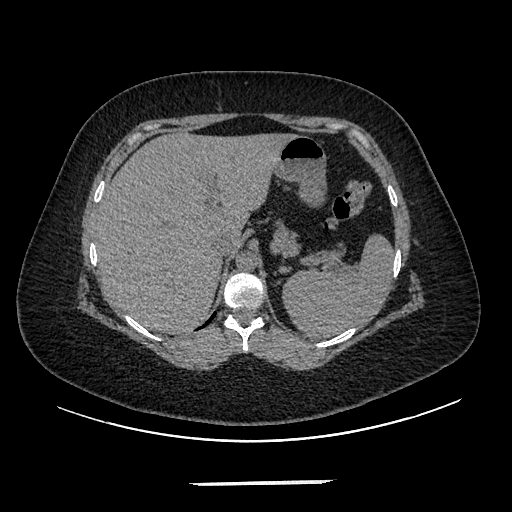
[im 707/804  soft-tissue]
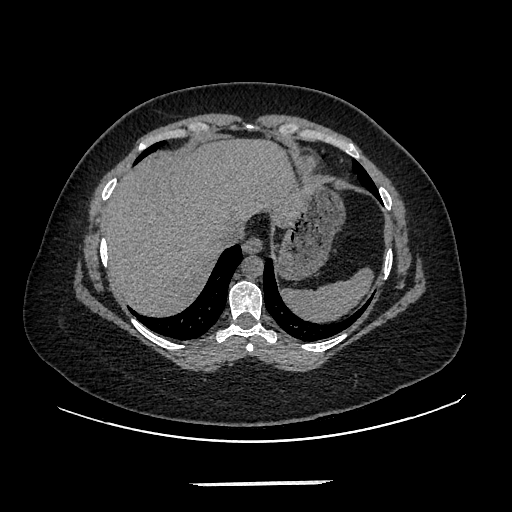
[im 771/804  soft-tissue]
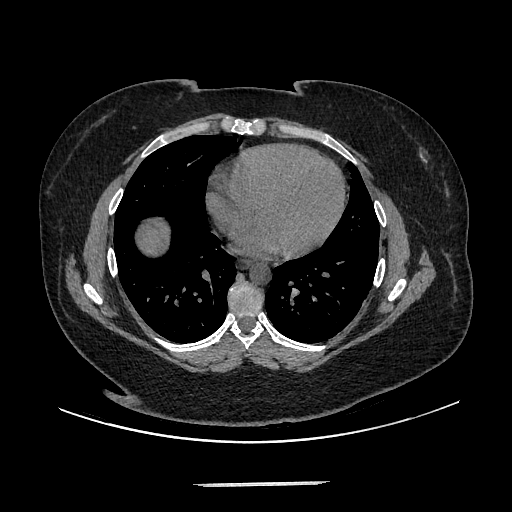

[Series 602: sag standard 2x2 · sagittal · 0.98mm/px · 3 of 230 slices shown]
[im 77/230  soft-tissue]
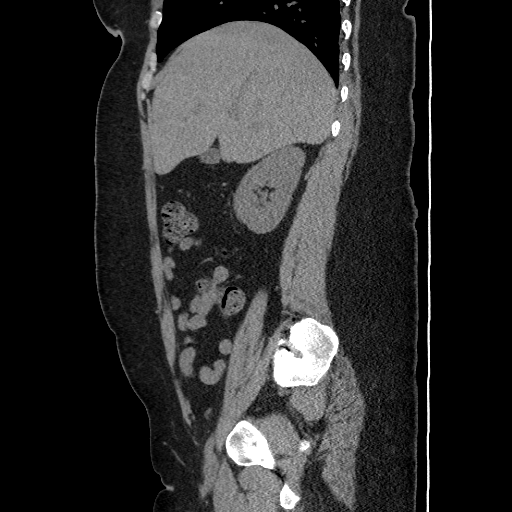
[im 102/230  soft-tissue]
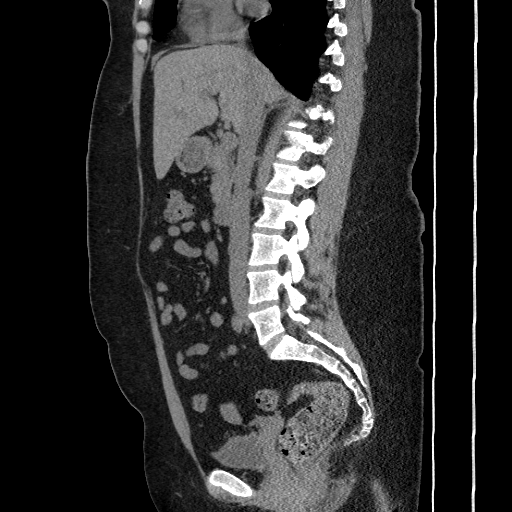
[im 128/230  soft-tissue]
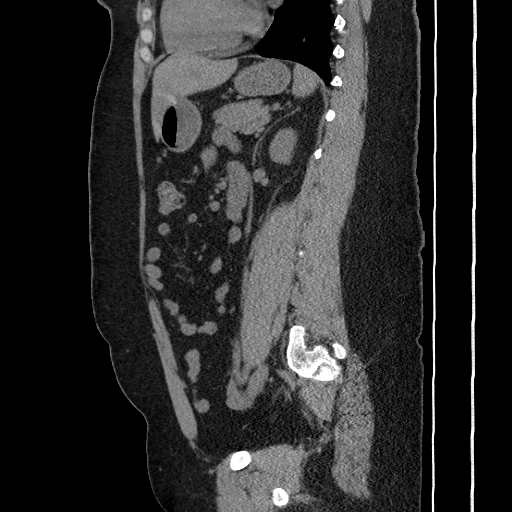

[17 of 46 positions shown; findings below may reference images not displayed]

FINDINGS: Bibasilar dependent atelectasis is present.
There are nonobstructing calculi in bilateral kidneys, the largest measuring 5 mm on the right.
The liver, gallbladder, pancreas, spleen, and adrenals are unremarkable on this noncontrast study. A moderate amount of fecal material is present in the colon.
No evidence of bowel obstruction. The appendix is within normal limits (coronal images 68-75). There is no mesenteric or retroperitoneal adenopathy.
The urinary bladder is incompletely distended at the time of the examination. The uterus and adnexa are unremarkable.   There is no free fluid or free air.
The osseous structures are unremarkable.
IMPRESSION: 
IMPRESSION: No obstructing ureteric calculus or hydronephrosis.
Nonobstructing calculi in bilateral kidneys.
Is the patient pregnant?
Waiting for Lab Result

## 2021-06-06 IMAGING — CT CT ABDOMEN PELVIS WITHOUT CONTRAST
2 of 3 series · 17 of 46 positions shown, 19 images · non-contrast
Comparison: 11/20/2020

BI LAT FLANK PAIN HX OF STONES
FINAL REPORT:
INDICATION: Flank pain, kidney stone suspected
EXAM: CT of the abdomen and pelvis without IV Contrast
Submitted images:
All CT scans at this facility use iterative reconstruction technique, dose modulation and/or weight based dosing when appropriate to reduce radiation dose to as low as reasonably achievable.

[Series 2: renal stone · axial · 0.84mm/px · z∈[-506,-82]mm · 14 of 196 slices shown, 16 images]
[im 13/196  soft-tissue]
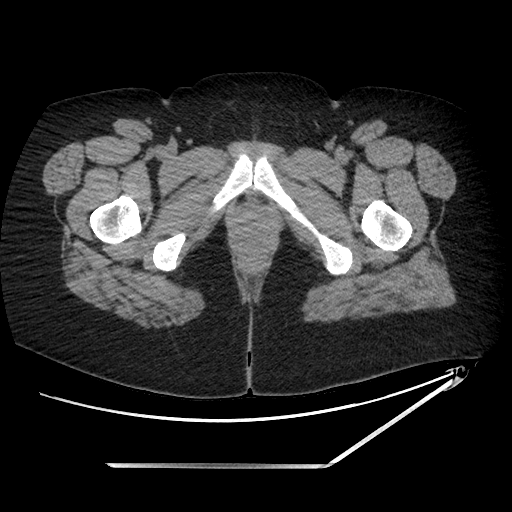
[im 13/196  bone]
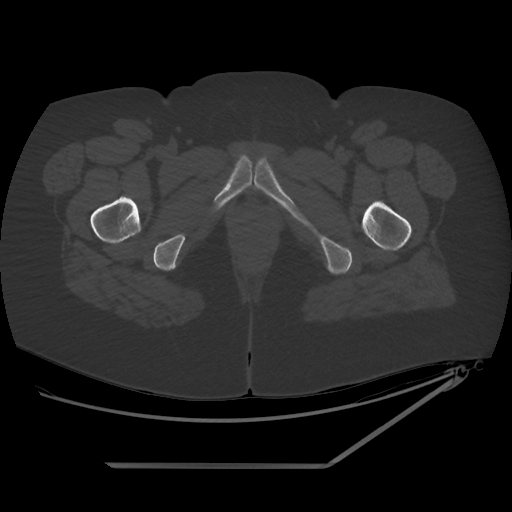
[im 26/196  soft-tissue]
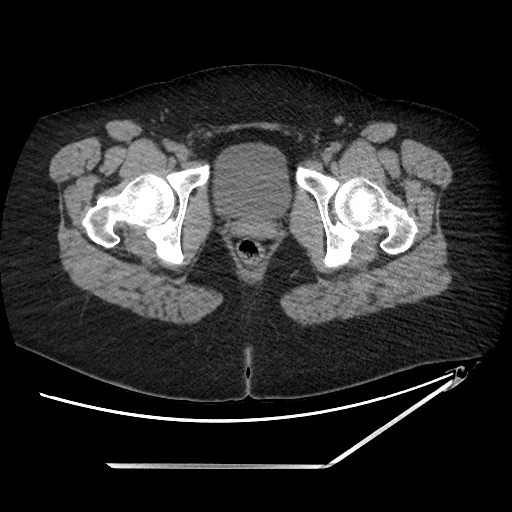
[im 38/196  soft-tissue]
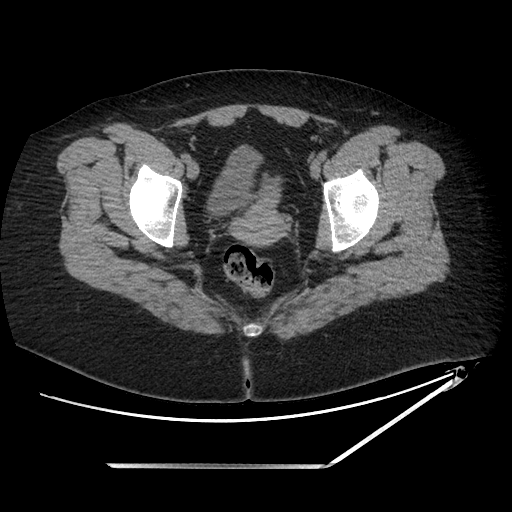
[im 51/196  soft-tissue]
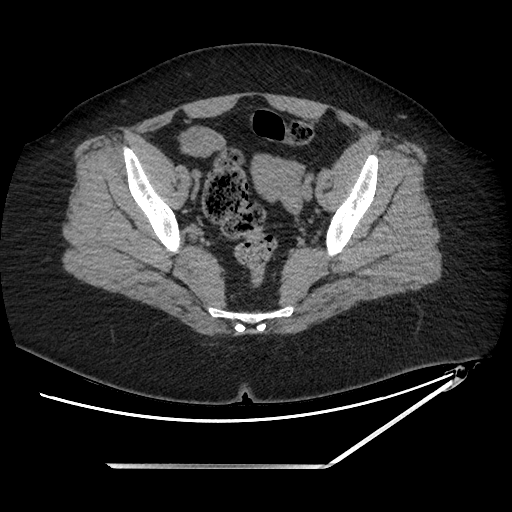
[im 63/196  soft-tissue]
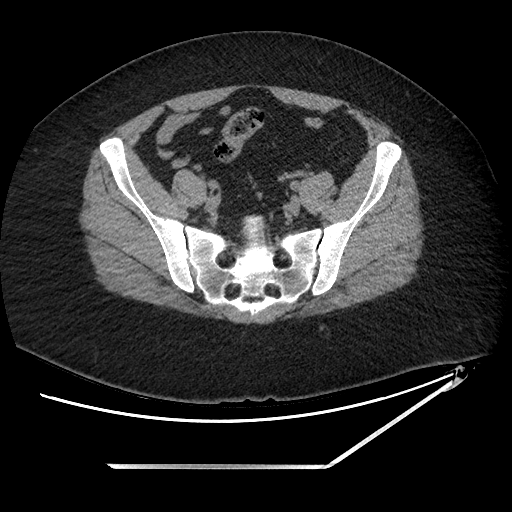
[im 76/196  soft-tissue]
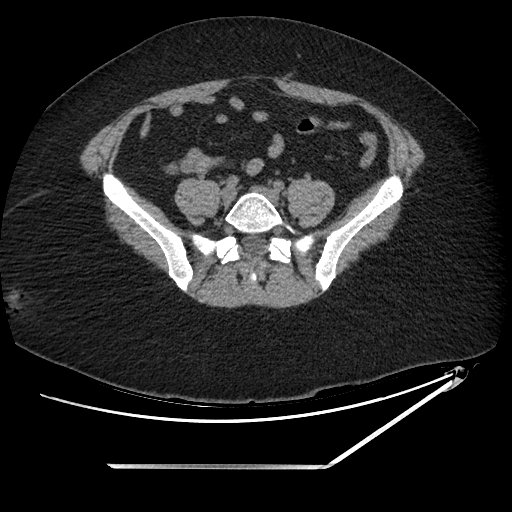
[im 89/196  soft-tissue]
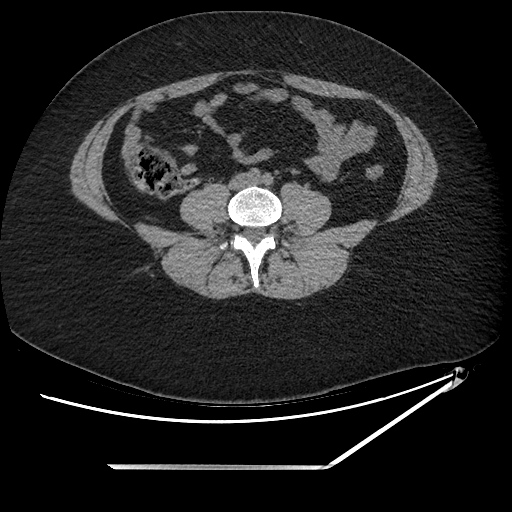
[im 107/196  soft-tissue]
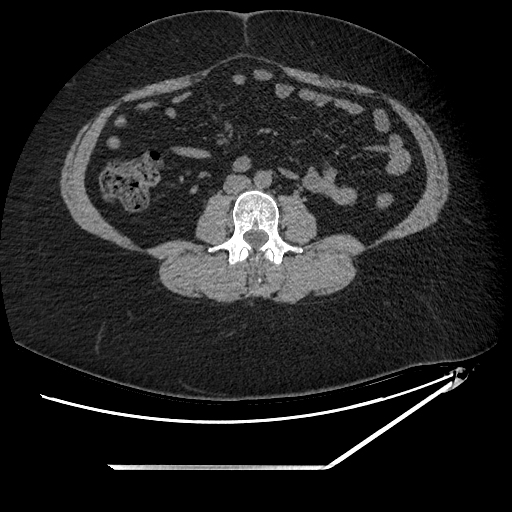
[im 120/196  soft-tissue]
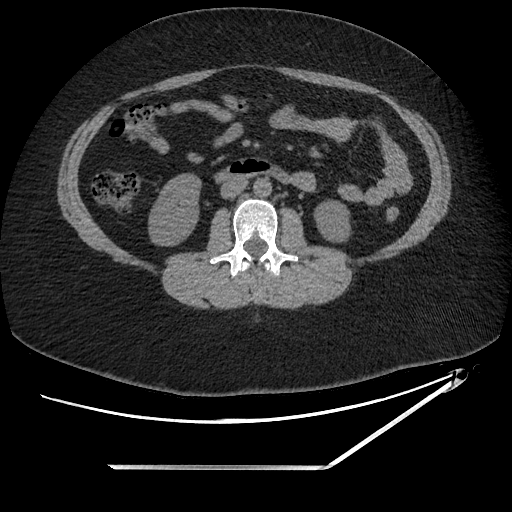
[im 120/196  bone]
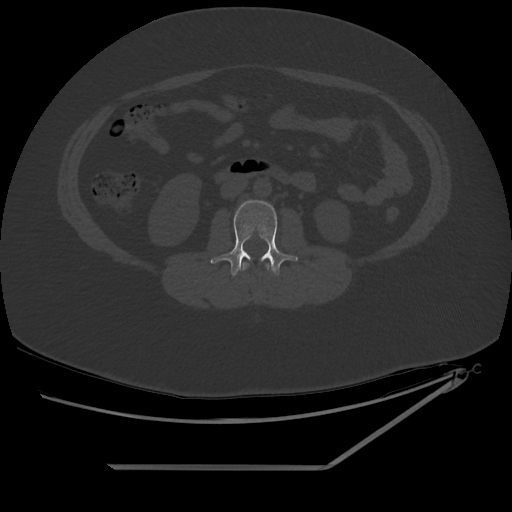
[im 133/196  soft-tissue]
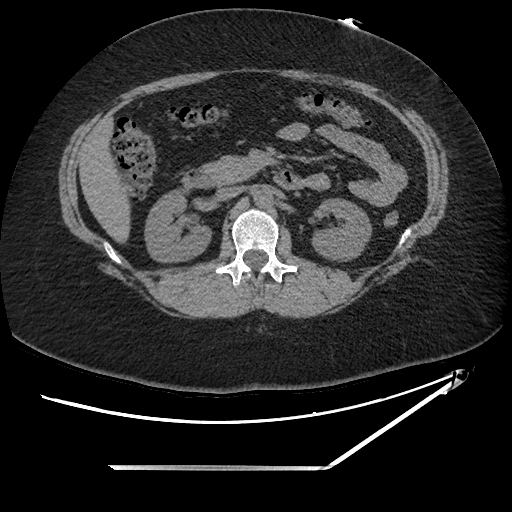
[im 145/196  soft-tissue]
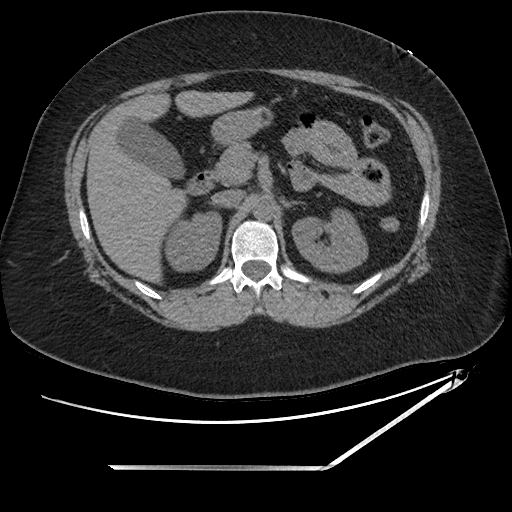
[im 158/196  soft-tissue]
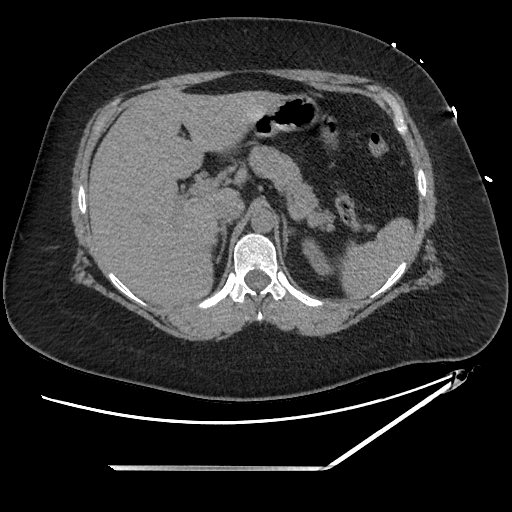
[im 170/196  soft-tissue]
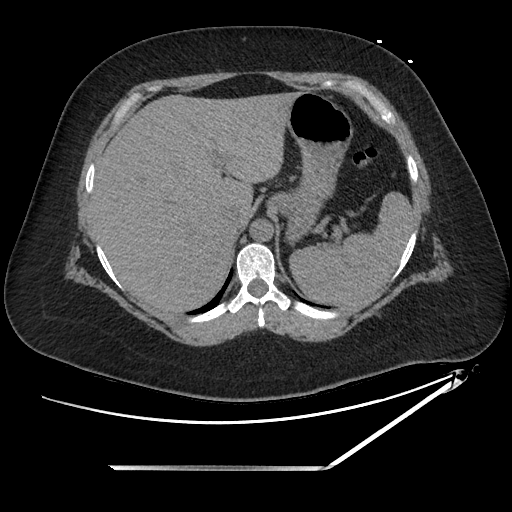
[im 183/196  soft-tissue]
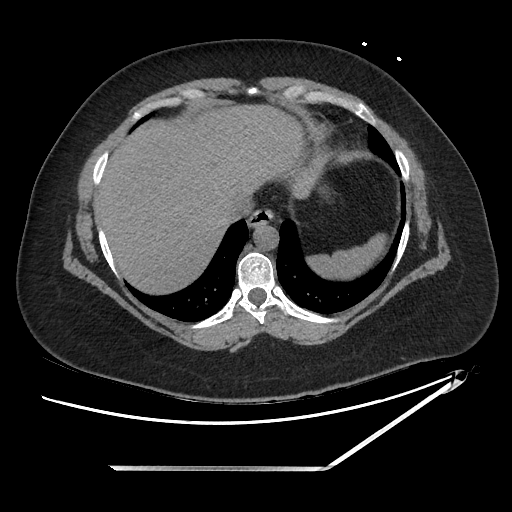

[Series 602: sag standard 2x2 · sagittal · 0.96mm/px · 3 of 215 slices shown]
[im 72/215  soft-tissue]
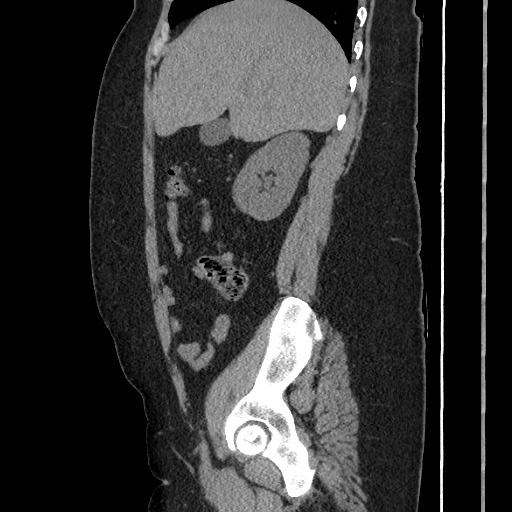
[im 96/215  soft-tissue]
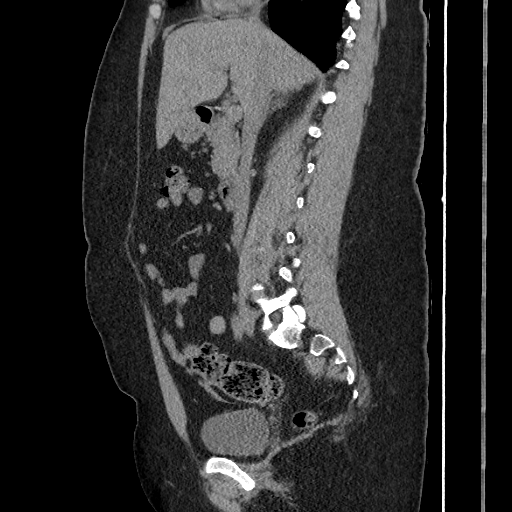
[im 119/215  soft-tissue]
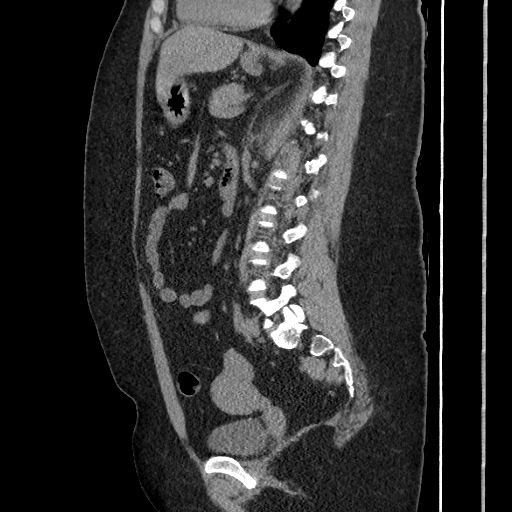

[17 of 46 positions shown; findings below may reference images not displayed]

FINDINGS: There is bilateral dependent atelectasis. Bone window evaluation demonstrates no evidence of an acute process. Soft tissue evaluation demonstrates small fat-containing of umbilical hernia. Heart size within normal limits. The liver gallbladder pancreas spleen and adrenal glands unremarkable. The stomach and small bowel loops are unremarkable. Bladder is within normal limits. There are bilateral renal stones identified measuring up to approximately 4 mm there is no evidence of hydronephrosis nor hydroureter. There is no retroperitoneal lymphadenopathy.
IMPRESSION: Bilateral nephrolithiasis.
Is the patient pregnant?
No

## 2021-08-06 IMAGING — CT CT ABDOMEN PELVIS WITHOUT CONTRAST
2 of 3 series · 17 of 46 positions shown, 19 images · non-contrast
Comparison: 06/06/2021.

Flank pain, patient having severe abdominal pain
FINAL REPORT:
EXAM: CT ABDOMEN PELVIS WITHOUT CONTRAST
INDICATION: Flank pain, kidney stone suspected
TECHNIQUE: CT abdomen and pelvis without IV contrast, using multiplanar reconstructions.
All CT scans at this facility use dose modulation and/or weight based dosing when appropriate to reduce radiation dose to as low as reasonably achievable. (#[id number]#)
Unless the patient's specific circumstances suggest otherwise, any liver lesion 0.5 cm or less, any cystic kidney lesion less than 1.0 cm, and/or any adrenal lesion 1.0 cm or less not otherwise characterized in this report as processing suspicious or indeterminate imaging features is/are most likely to be benign and do not require follow-up imaging or biopsy.

[Series 2: renal stone · axial · 0.92mm/px · z∈[-453,-30]mm · 14 of 195 slices shown, 16 images]
[im 13/195  soft-tissue]
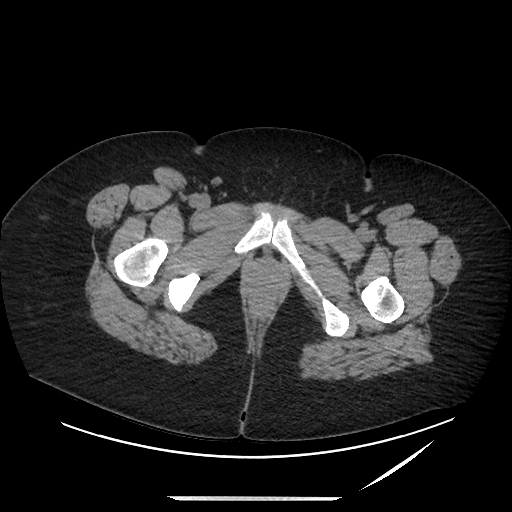
[im 13/195  bone]
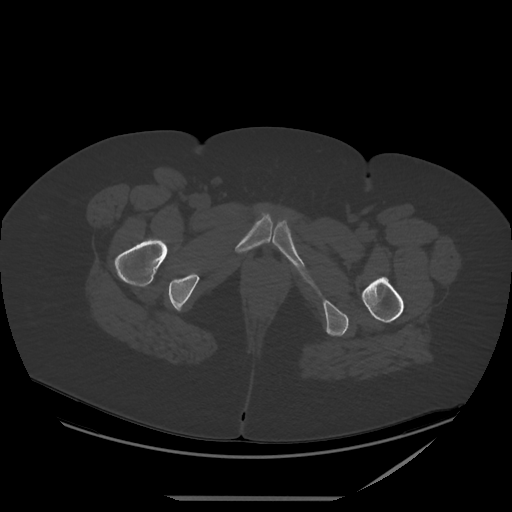
[im 26/195  soft-tissue]
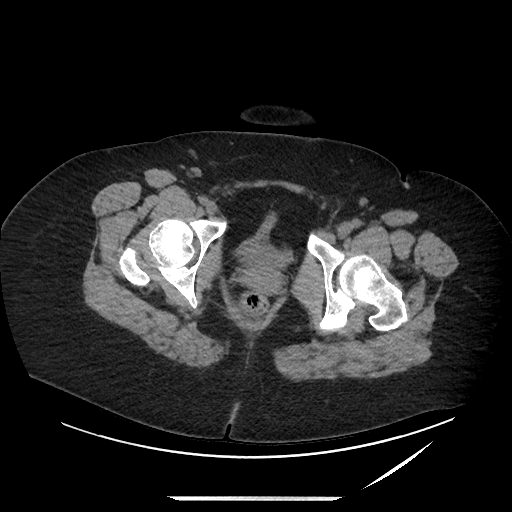
[im 38/195  soft-tissue]
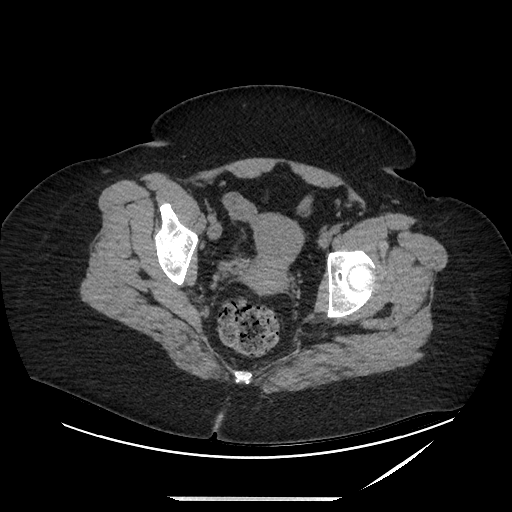
[im 51/195  soft-tissue]
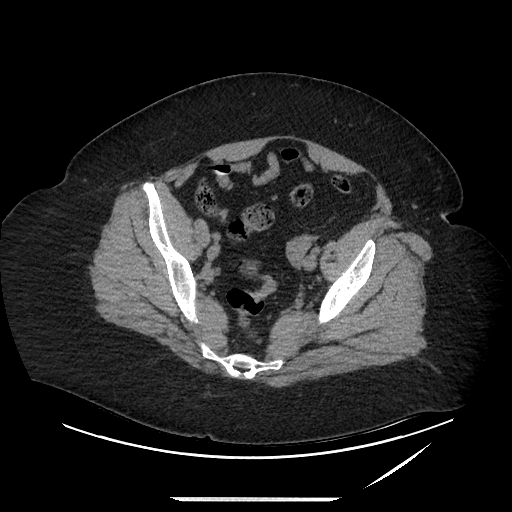
[im 63/195  soft-tissue]
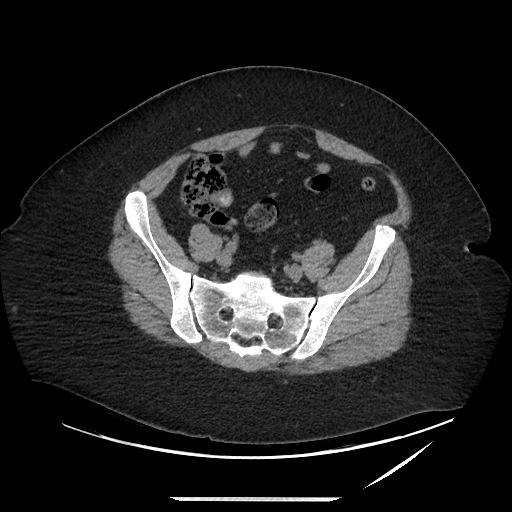
[im 76/195  soft-tissue]
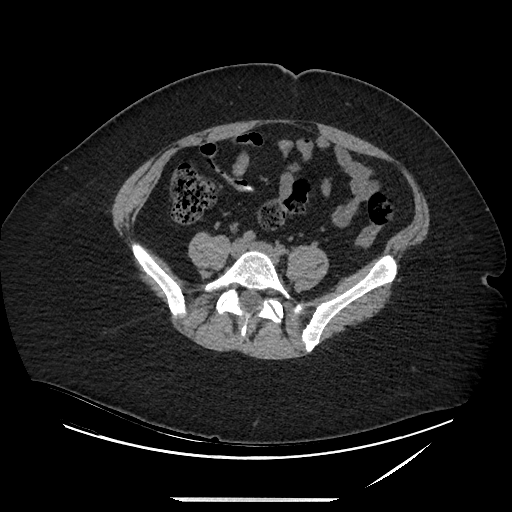
[im 88/195  soft-tissue]
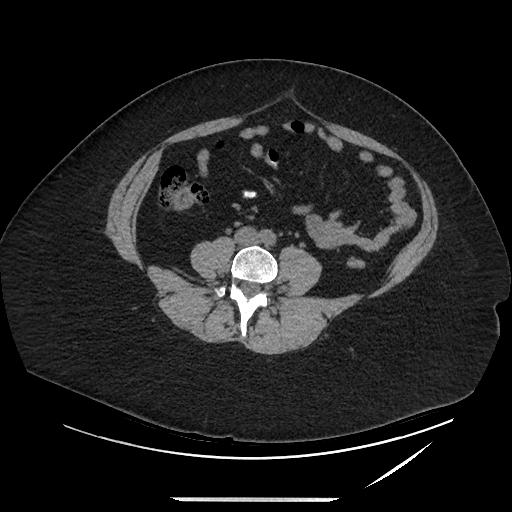
[im 107/195  soft-tissue]
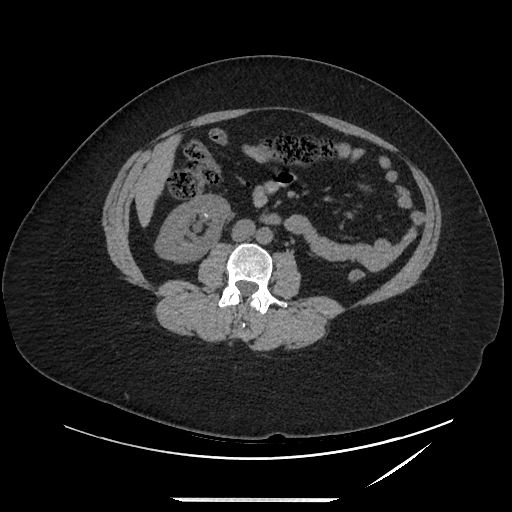
[im 119/195  soft-tissue]
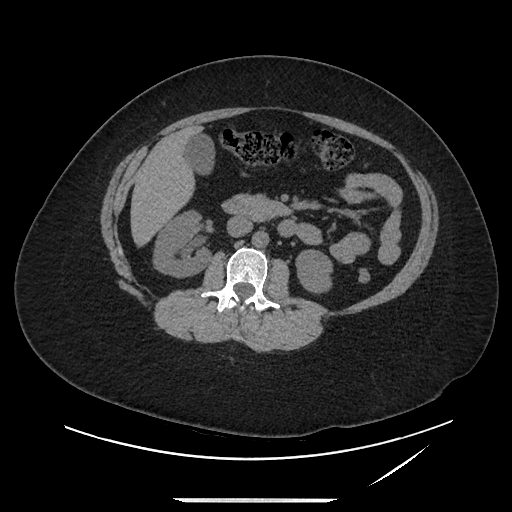
[im 119/195  bone]
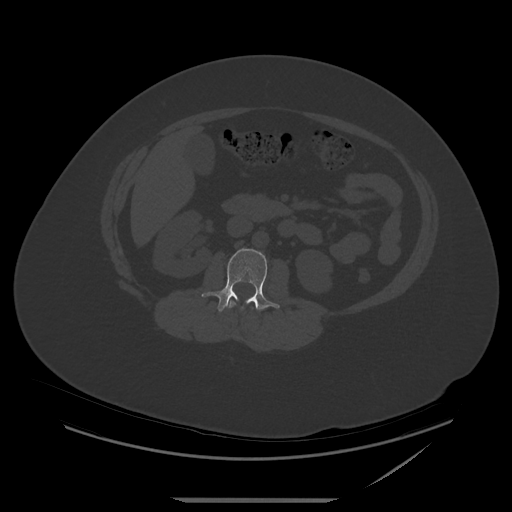
[im 132/195  soft-tissue]
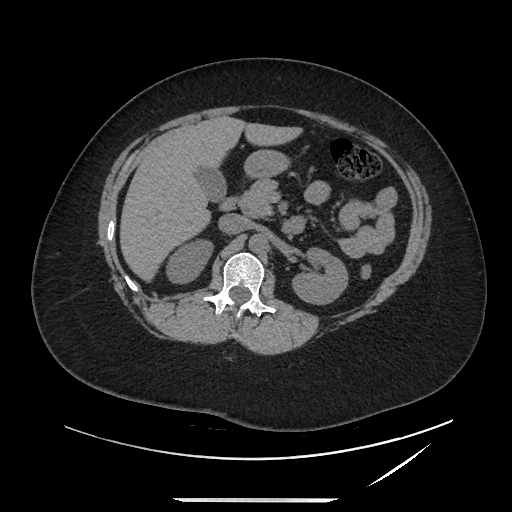
[im 144/195  soft-tissue]
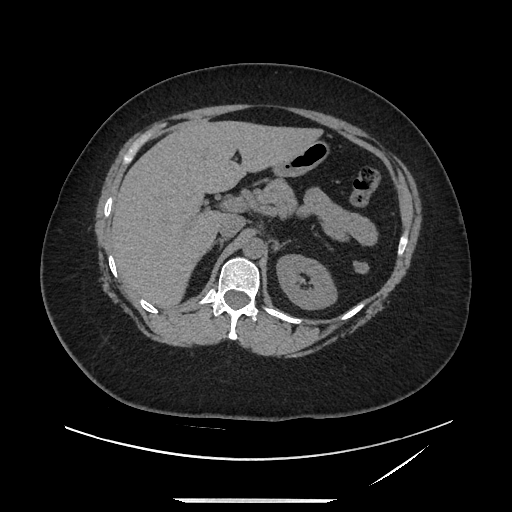
[im 157/195  soft-tissue]
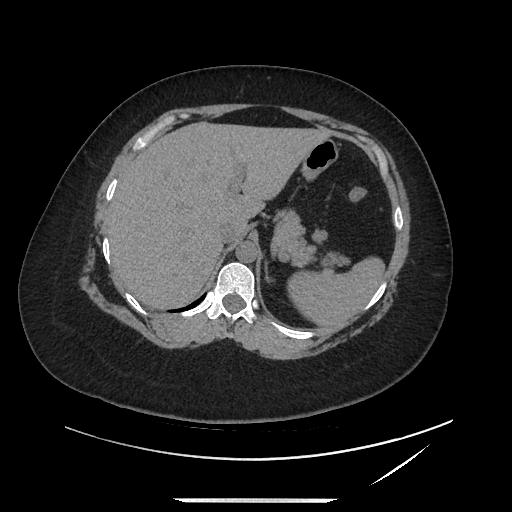
[im 169/195  soft-tissue]
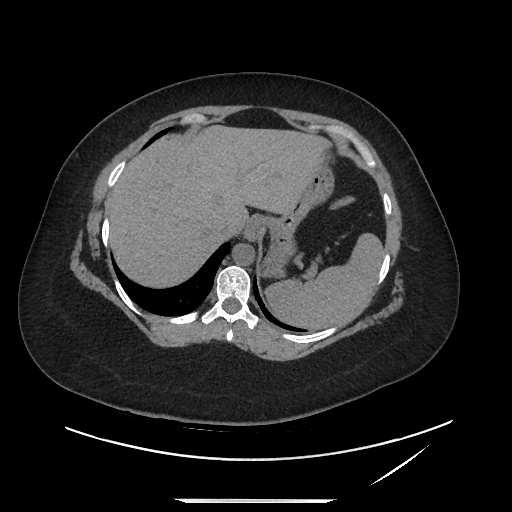
[im 182/195  soft-tissue]
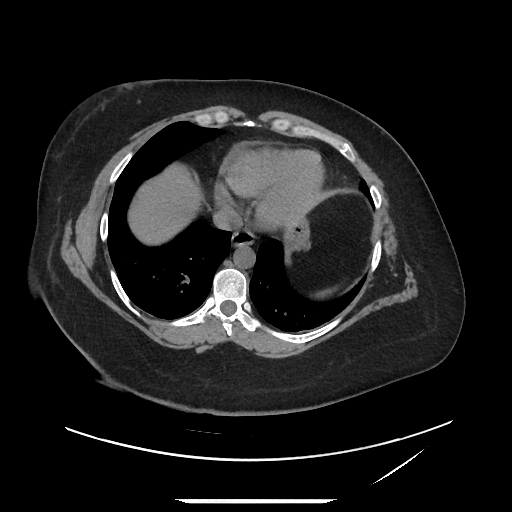

[Series 602: sag standard 2x2 · sagittal · 0.95mm/px · 3 of 237 slices shown]
[im 79/237  soft-tissue]
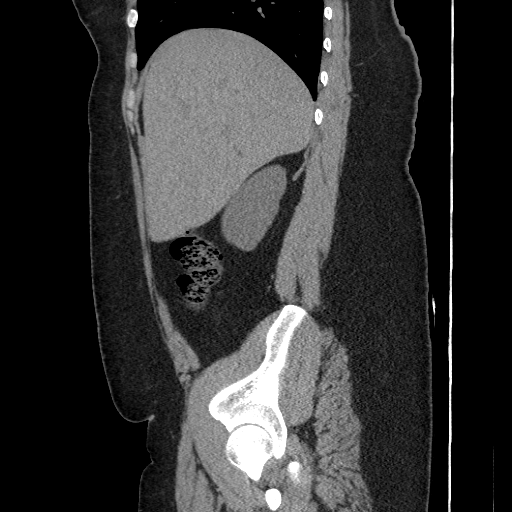
[im 105/237  soft-tissue]
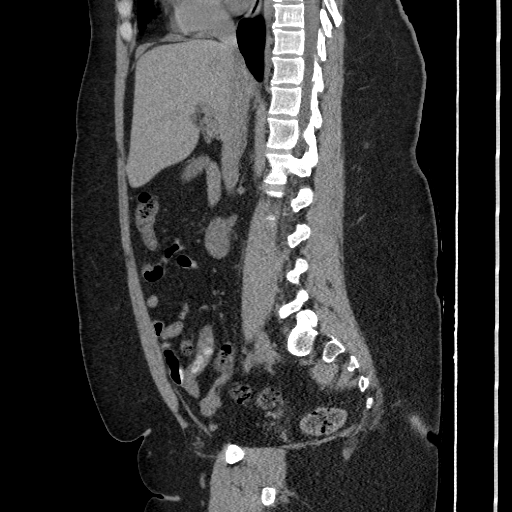
[im 132/237  soft-tissue]
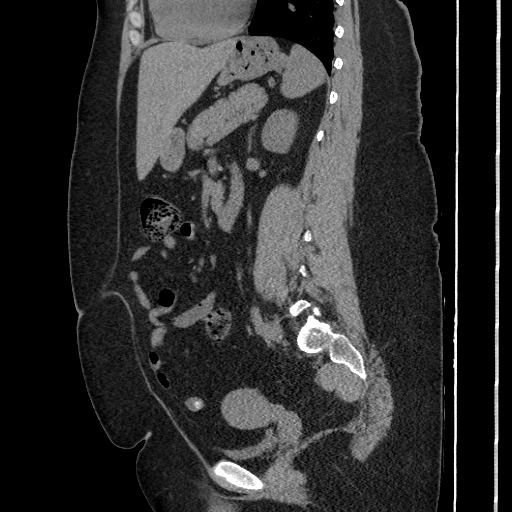

[17 of 46 positions shown; findings below may reference images not displayed]

FINDINGS: LOWER CHEST: Unremarkable.
Limited evaluation of the solid abdominal viscera without intravenous contrast.
LIVER: Unremarkable.
BILIARY TREE: Unremarkable.
SPLEEN: Unremarkable.
PANCREAS: Unremarkable.
ADRENAL GLANDS: Unremarkable.
KIDNEYS/URETERS/BLADDER: Obstructing 4 mm calculus in the right UVJ (series 2, image 166) with mild right hydronephrosis. Additional nonobstructing bilateral nephrolithiasis measures up to 4 mm on the left. Urinary bladder is unremarkable.
GENITAL STRUCTURES: Unremarkable.
GASTROINTESTINAL TRACT: No focal bowel thickening or obstruction. The appendix is not definitively visualized, however there are no inflammatory changes in the right lower quadrant.
LYMPHATICS: No lymphadenopathy.
VASCULATURE: Unremarkable.
ABDOMINAL CAVITY: Unremarkable.
ABDOMINAL WALL/SOFT TISSUES: Rectus diastases.
BONES: No acute osseous abnormalities.
IMPRESSION: Obstructing 4 mm calculus in the right UVJ with mild right hydronephrosis.
Is the patient pregnant?
No

## 2022-02-11 IMAGING — CR XR SHOULDER 2+ VIEWS RIGHT
1 series · 3 of 3 positions shown · non-contrast
Comparison: none

Right shoulder pain
FINAL REPORT:
INDICATION: shoulder pain pain

[Series 5086: AP · right · 3 of 3 slices shown]
[im 1/3]
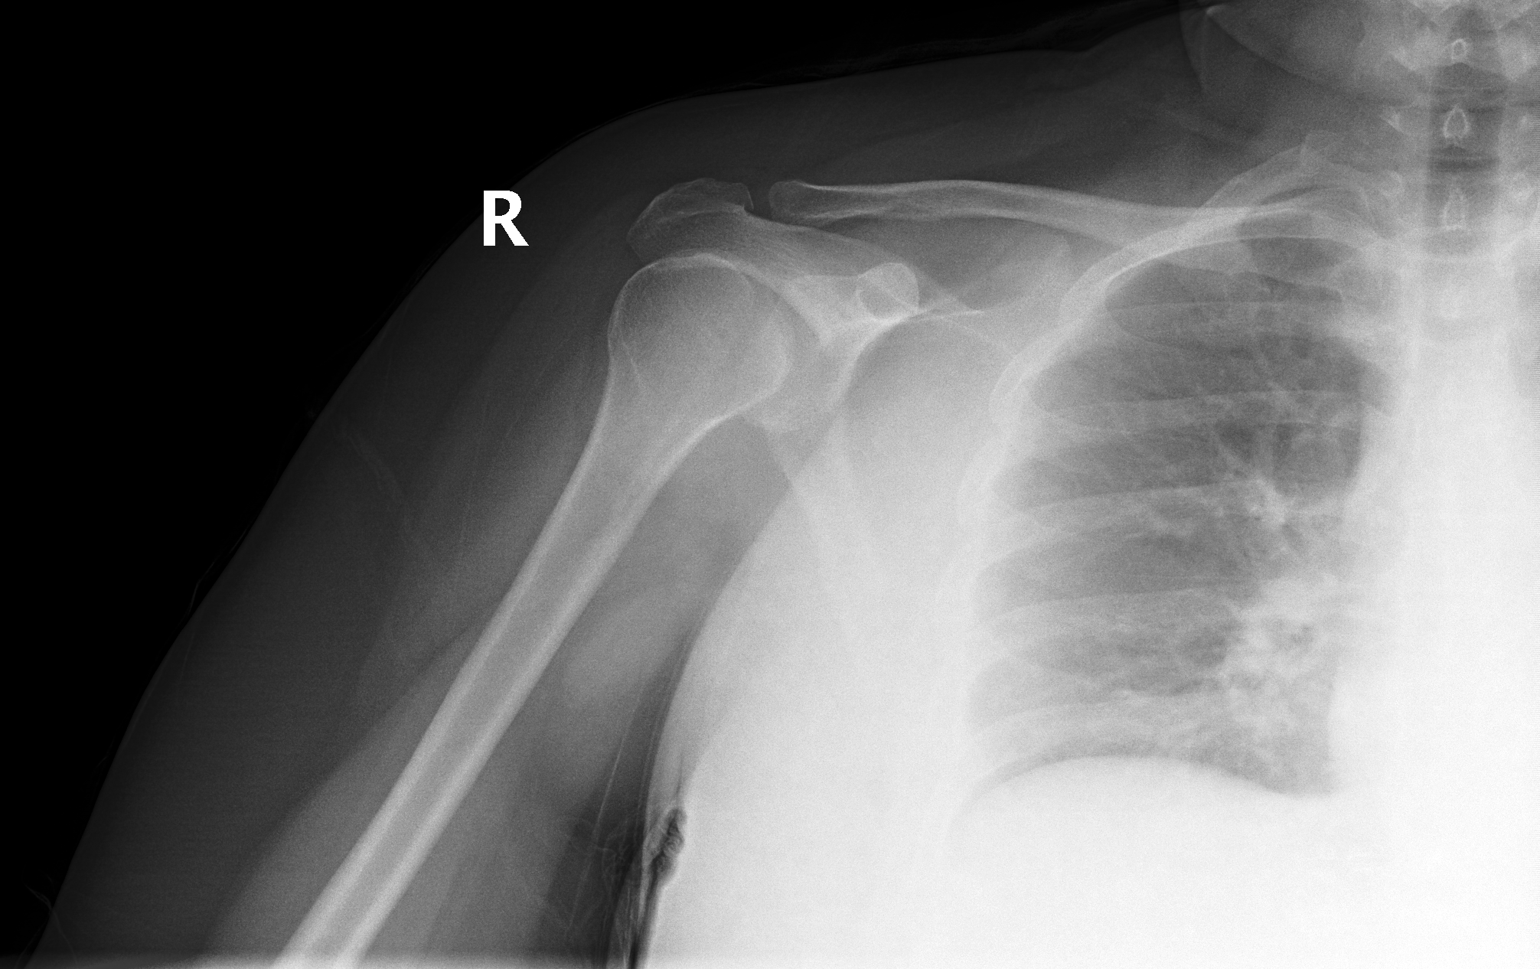
[im 2/3]
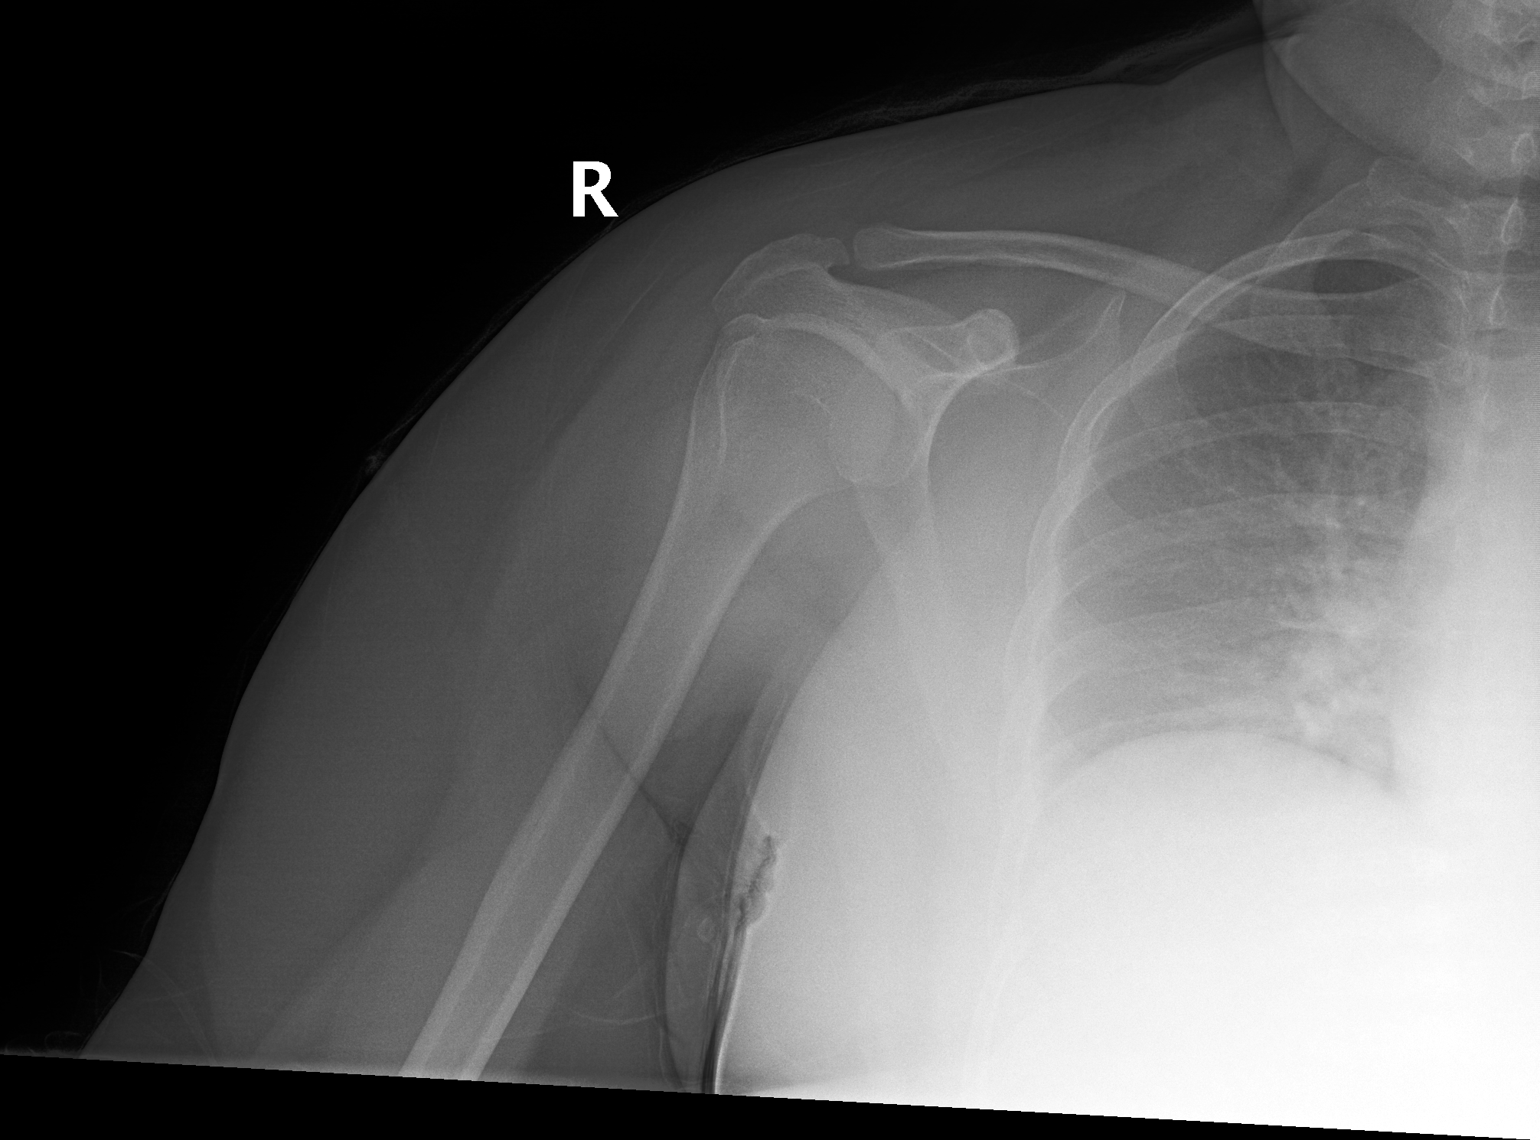
[im 3/3]
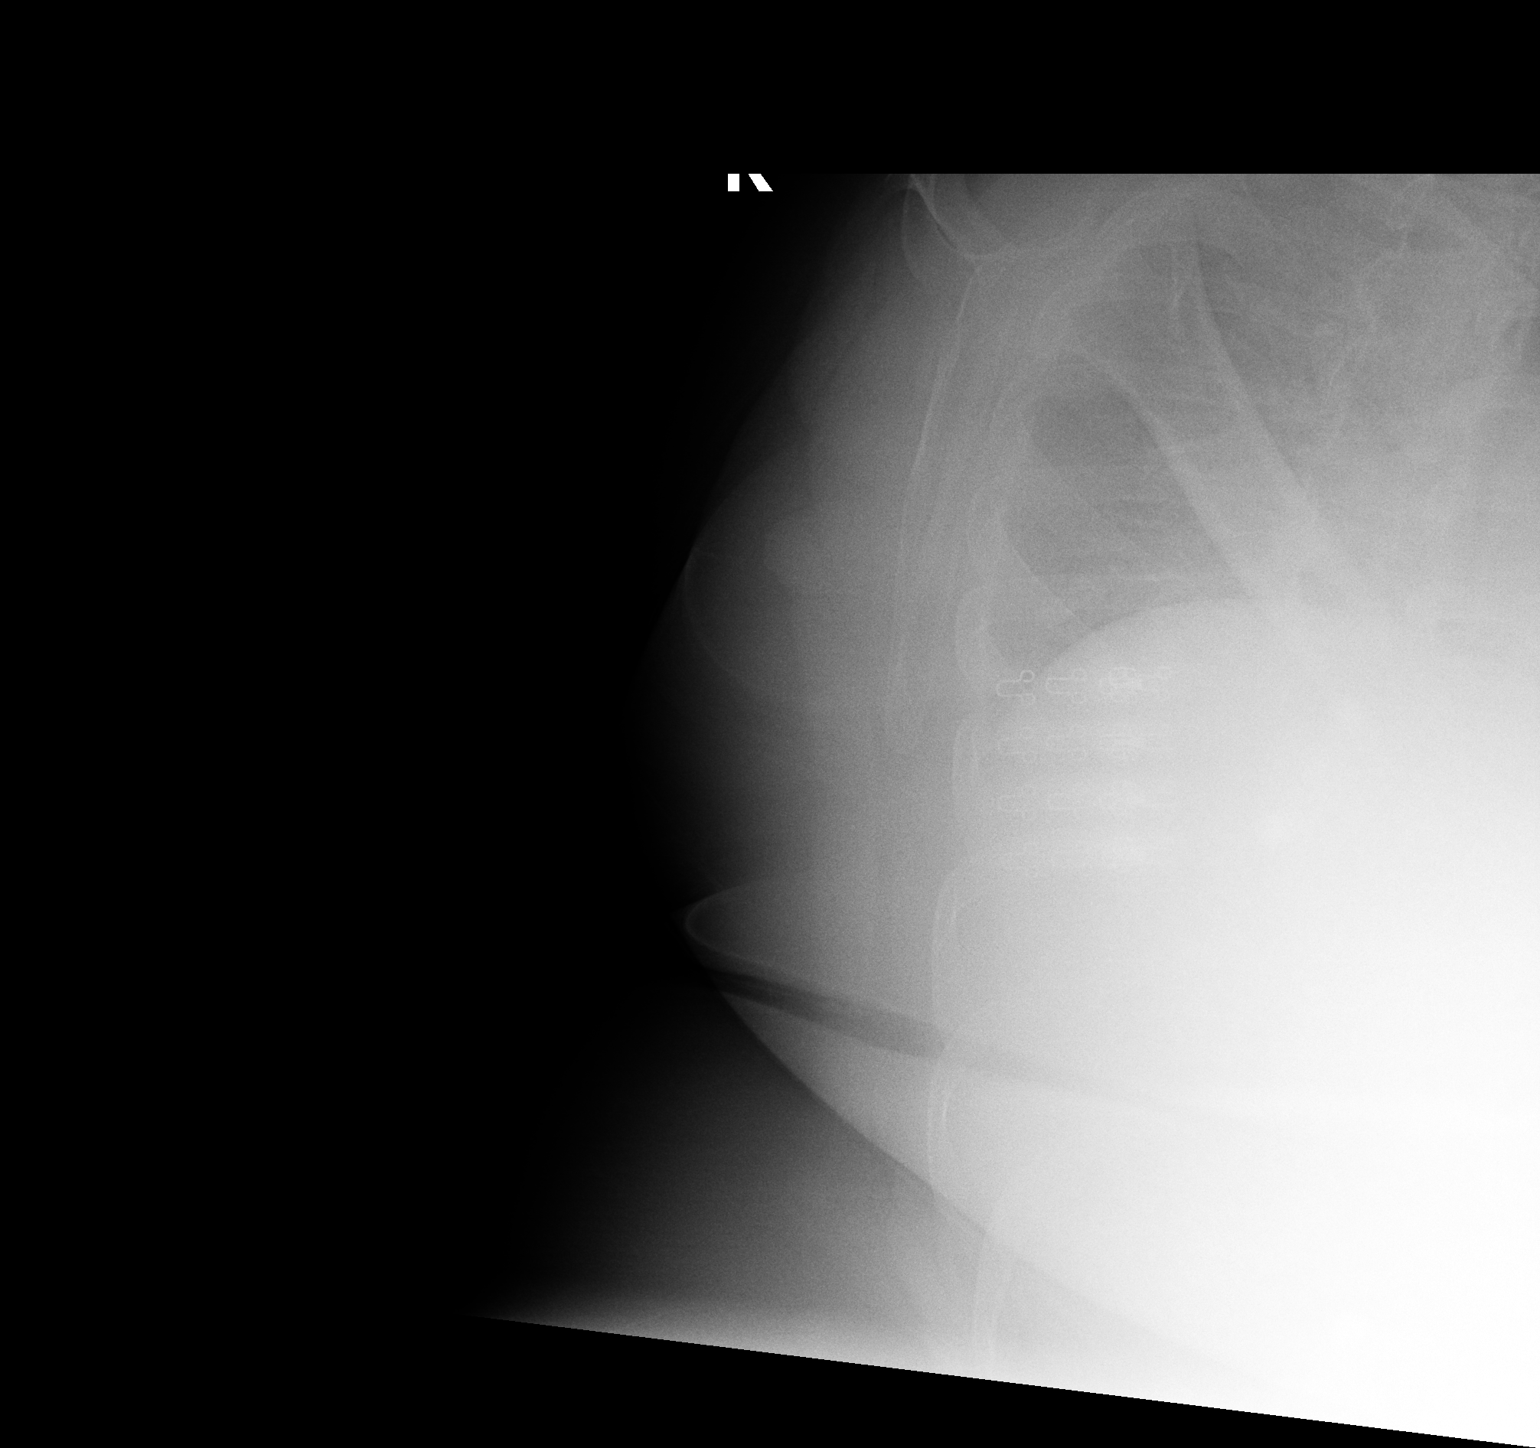

[3 of 3 positions shown; findings below may reference images not displayed]

FINDINGS: 3 views of the right shoulder show no evidence of acute fracture or subluxation.  The glenohumeral and acromioclavicular joint are maintained. visualized portions of the hemithorax are unremarkable. No suspicious radiopaque foreign body.
IMPRESSION: 
IMPRESSION: No significant abnormality
Is the patient pregnant?
No

## 2022-02-11 IMAGING — CT CT HEAD WITHOUT CONTRAST
2 of 3 series · 15 of 40 positions shown, 18 images · non-contrast
Comparison: None available

Sudden headaches, severe and worsening, very sensitive to light
FINAL REPORT:
INDICATION: Headache, sudden, severe Pain
Exam: CT of the head without IV Contrast
All CT scans at this facility use iterative reconstruction technique, dose modulation and/or weight based dosing when appropriate to reduce radiation dose to as low as reasonably achievable.

[Series 2: head stnd · axial · 0.59mm/px · z∈[+8,+140]mm · 12 of 32 slices shown, 15 images]
[im 3/32  brain]
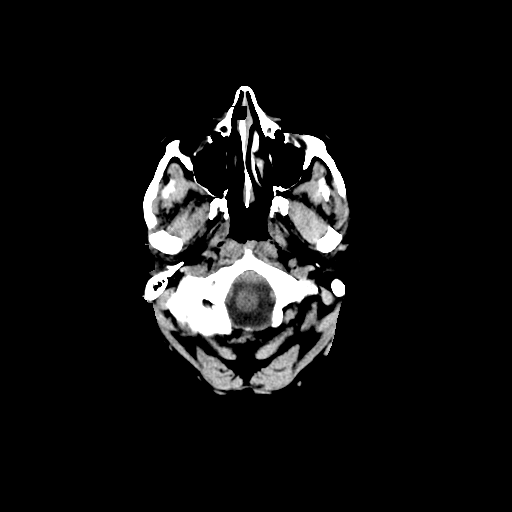
[im 3/32  bone]
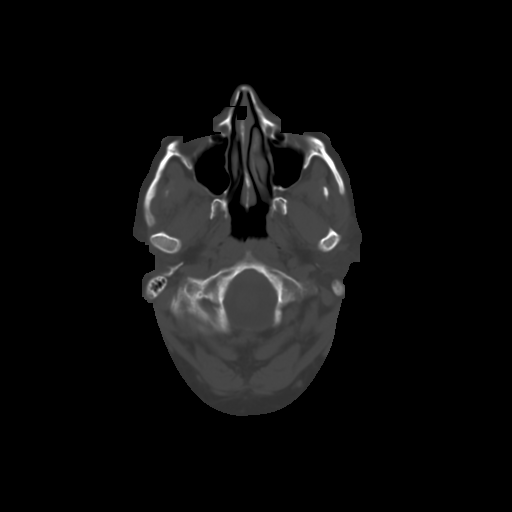
[im 5/32  brain]
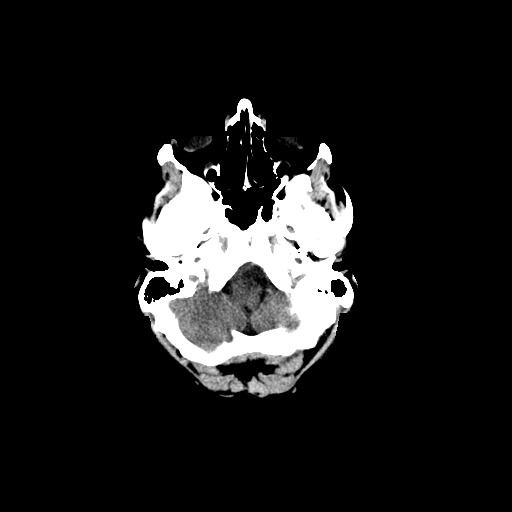
[im 7/32  brain]
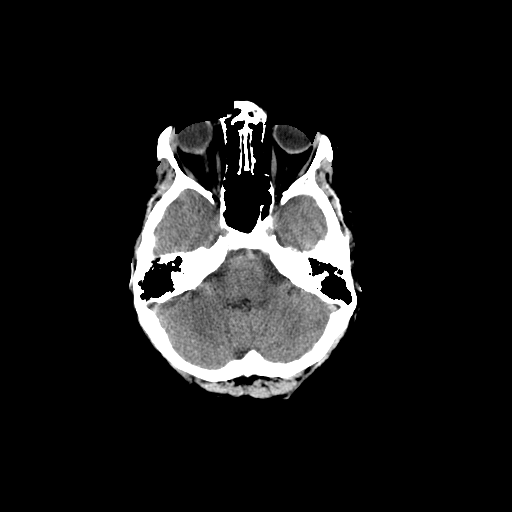
[im 10/32  brain]
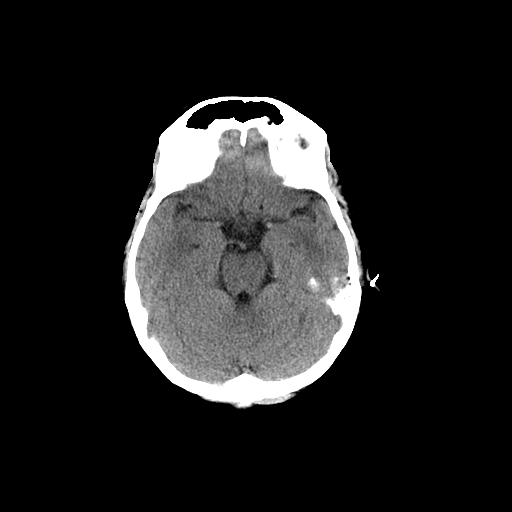
[im 12/32  brain]
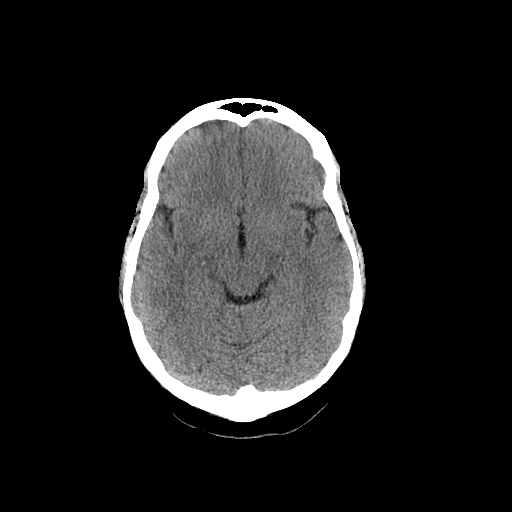
[im 12/32  bone]
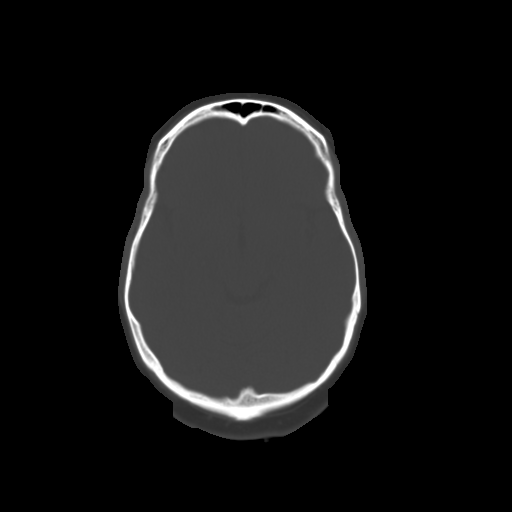
[im 14/32  brain]
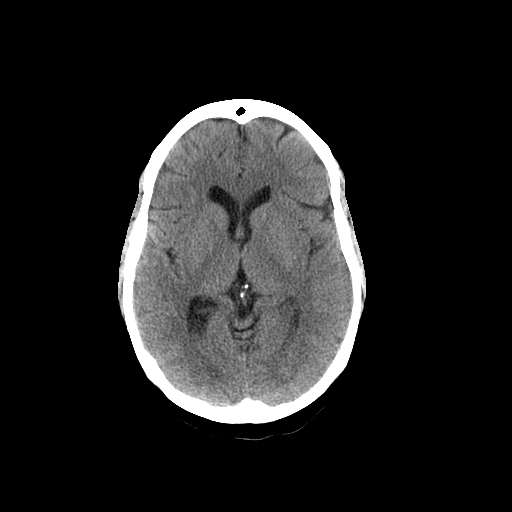
[im 18/32  brain]
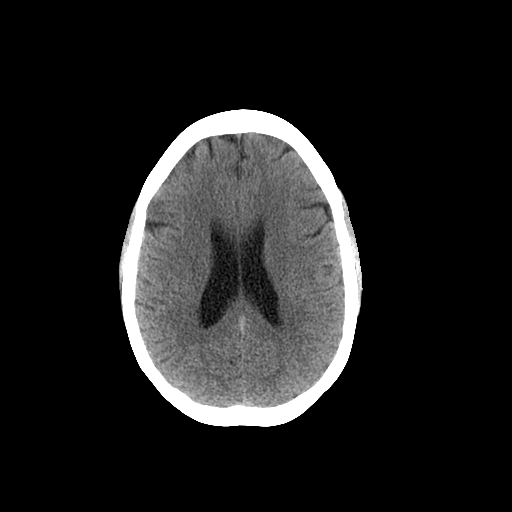
[im 20/32  brain]
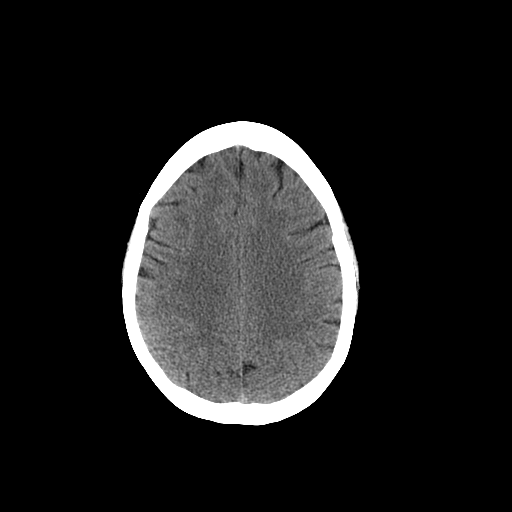
[im 22/32  brain]
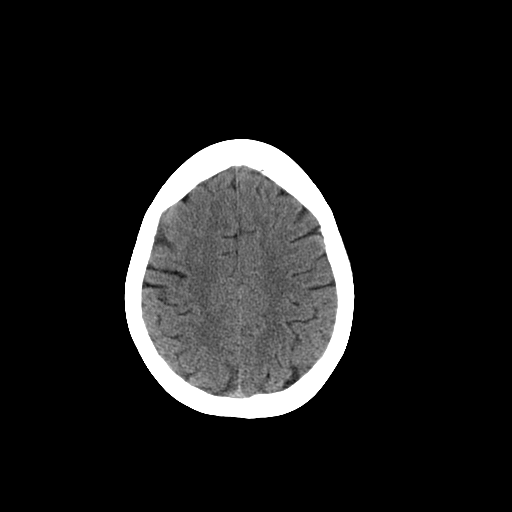
[im 22/32  bone]
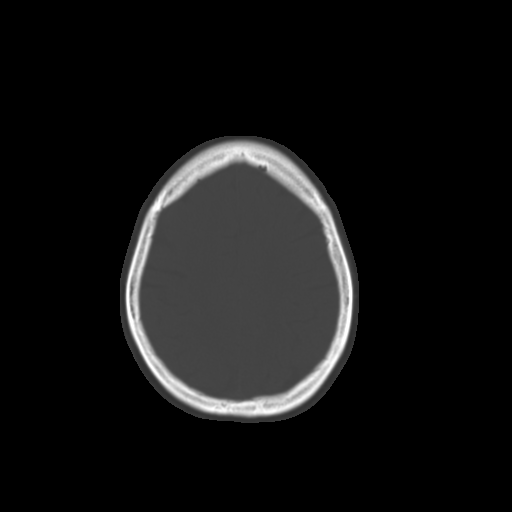
[im 25/32  brain]
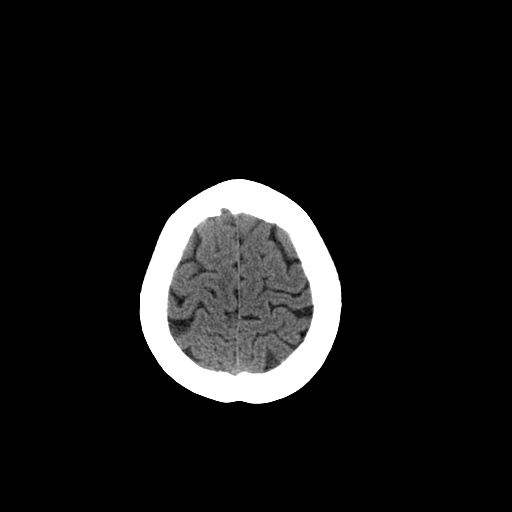
[im 27/32  brain]
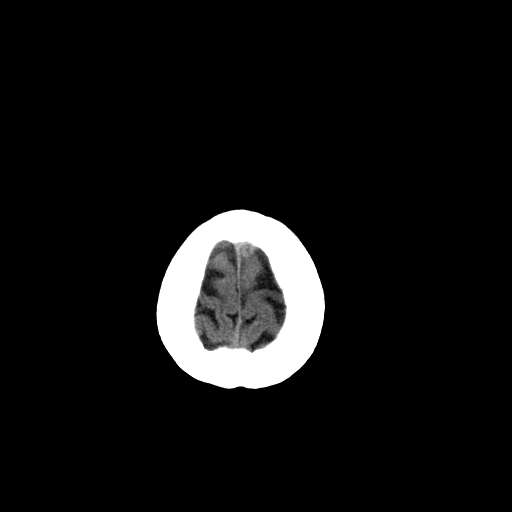
[im 29/32  brain]
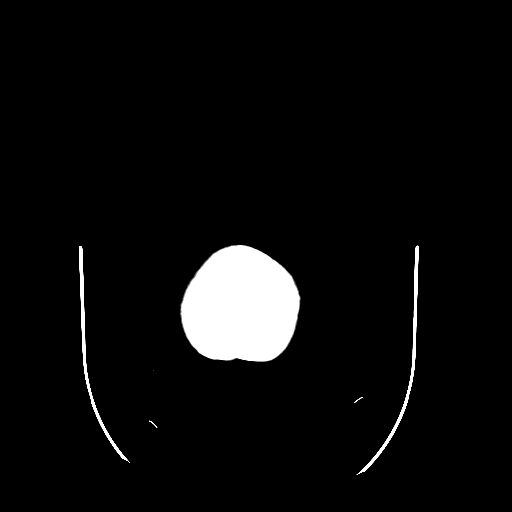

[Series 601: cor head · coronal · 0.59mm/px · 3 of 109 slices shown]
[im 22/109  brain]
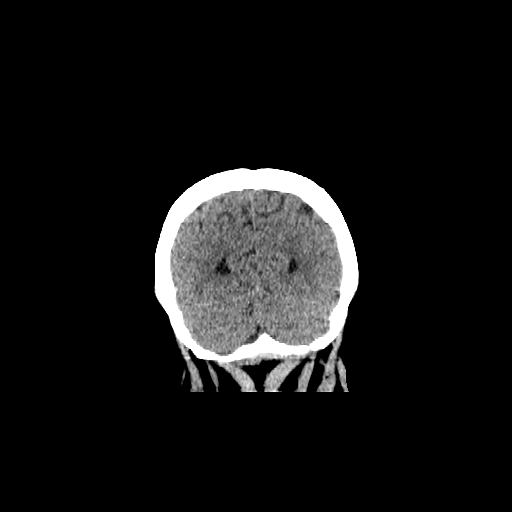
[im 44/109  brain]
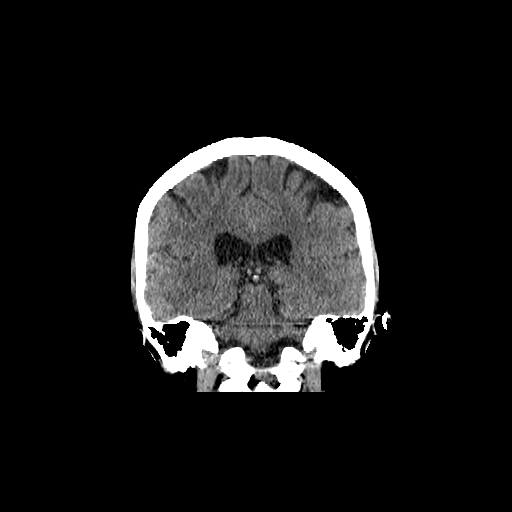
[im 65/109  brain]
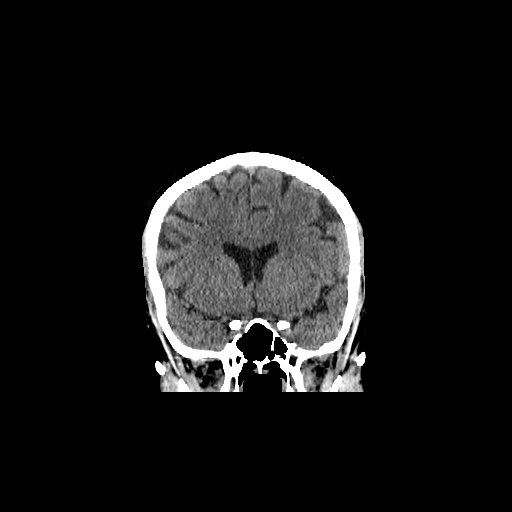

[15 of 40 positions shown; findings below may reference images not displayed]

FINDINGS: Motion degraded study.
[There is no CT evidence of acute large territorial infarction, intracranial hemorrhage or intracranial mass.]
[The ventricles and sulci are normal in size and configuration.]
The visualized portion of the [paranasal sinuses,] [mastoid air cells,][ and middle ear cavities ]are clear. [The visualized portion of the orbits are normal.]
No calvarial fracture is identified. The extracranial soft tissues are unremarkable.
IMPRESSION: 
IMPRESSION: No acute intracranial abnormality.
All CT scans at this facility use dose modulation and/or weight based dosing when appropriate to reduce radiation dose to as low as reasonably achievable.
Is the patient pregnant?
No

## 2023-05-10 IMAGING — CT CT ABDOMEN PELVIS WITHOUT CONTRAST
2 of 4 series · 17 of 46 positions shown, 19 images · non-contrast
Comparison: 08/06/2021

RIGHT SIDE FLANK PAIN FEVER
HX STONES
LITHOTRIPSY/CYSTO
FINAL REPORT:
EXAM: CT ABDOMEN PELVIS WITHOUT CONTRAST
INDICATION: Flank pain, kidney stone suspected

[Series 3: thins · axial · 0.92mm/px · z∈[-481,-29]mm · 14 of 788 slices shown, 16 images]
[im 32/788  soft-tissue]
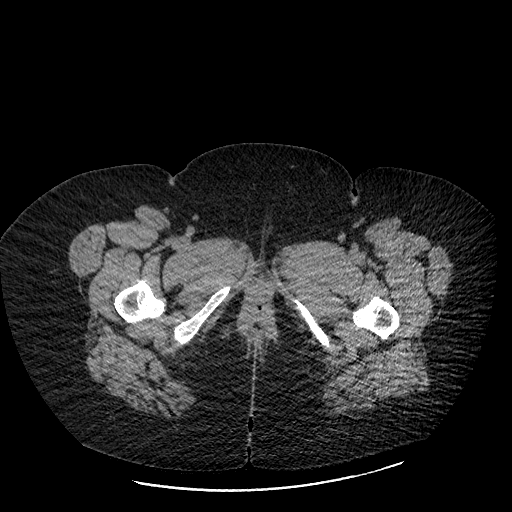
[im 32/788  bone]
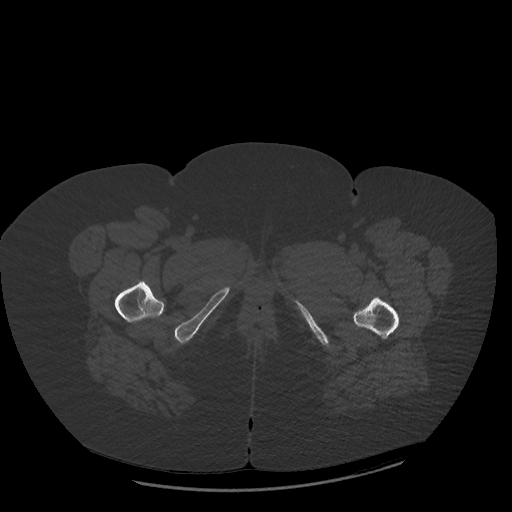
[im 95/788  soft-tissue]
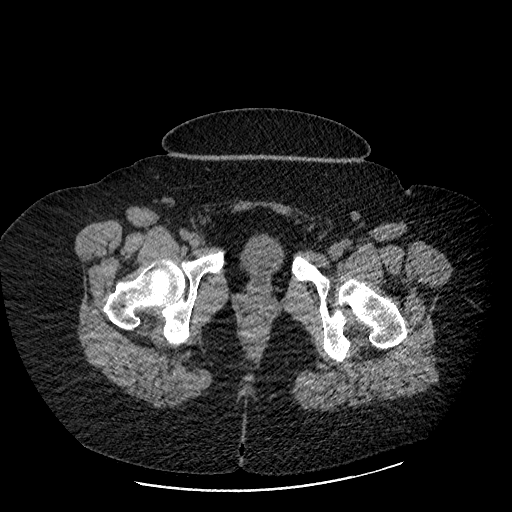
[im 158/788  soft-tissue]
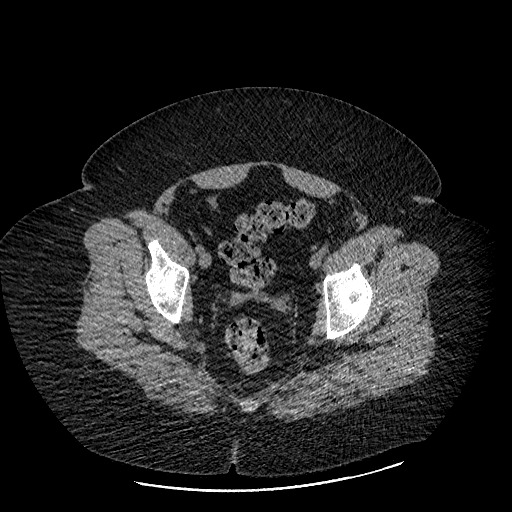
[im 221/788  soft-tissue]
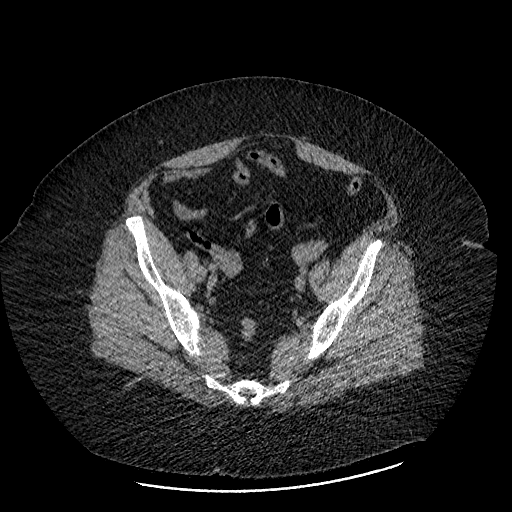
[im 252/788  soft-tissue]
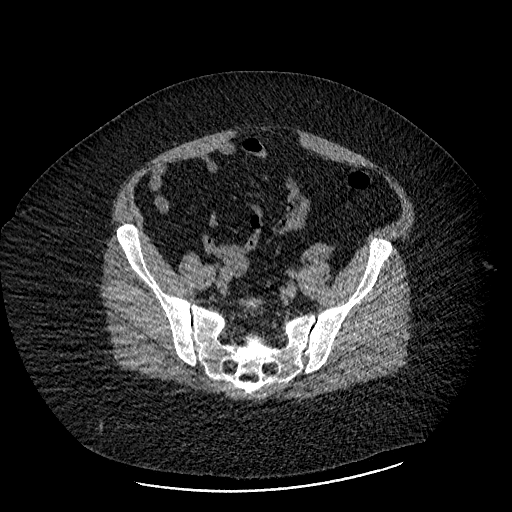
[im 315/788  soft-tissue]
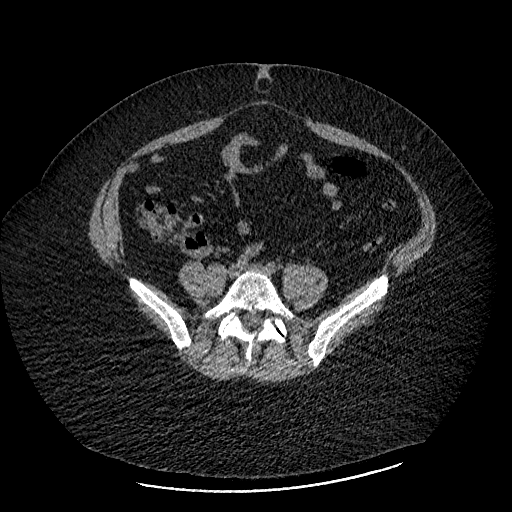
[im 378/788  soft-tissue]
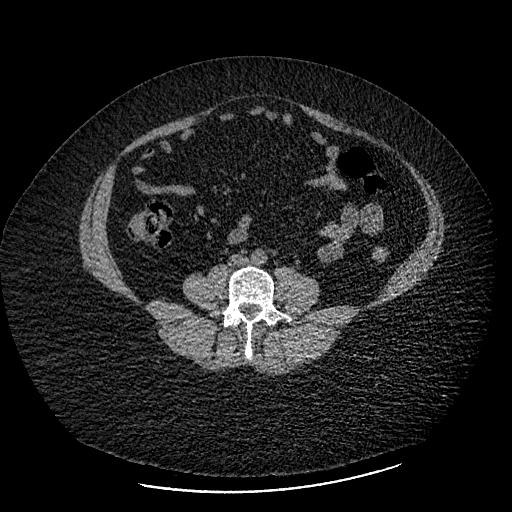
[im 410/788  soft-tissue]
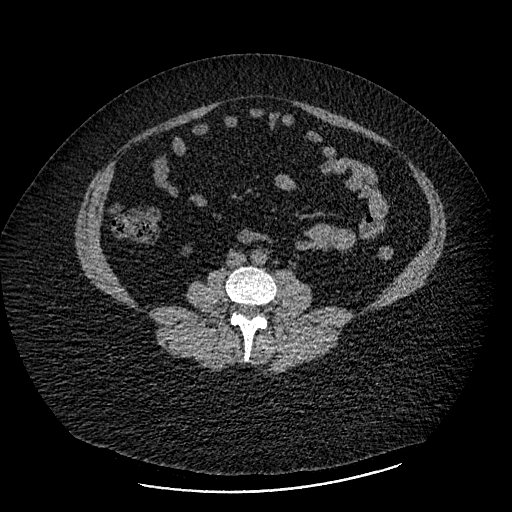
[im 473/788  soft-tissue]
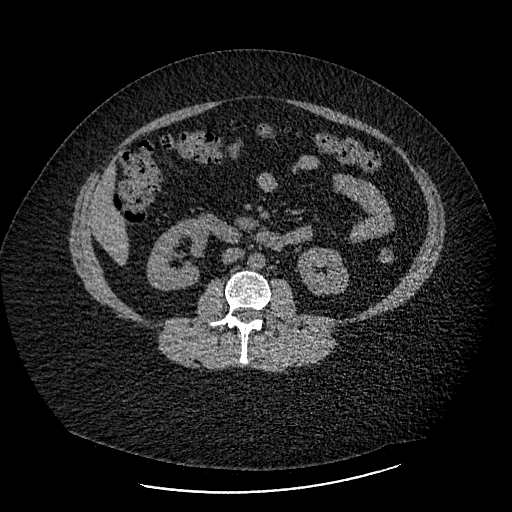
[im 473/788  bone]
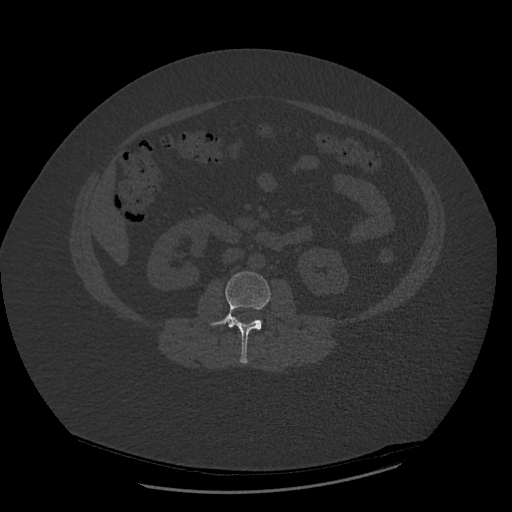
[im 536/788  soft-tissue]
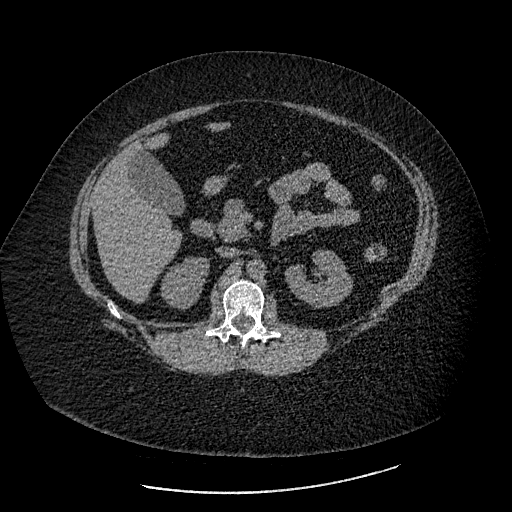
[im 599/788  soft-tissue]
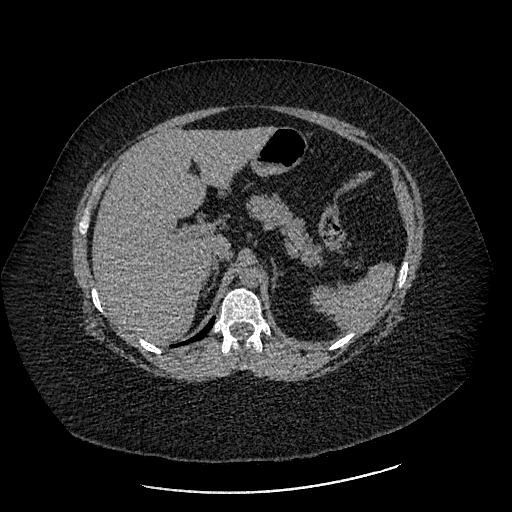
[im 630/788  soft-tissue]
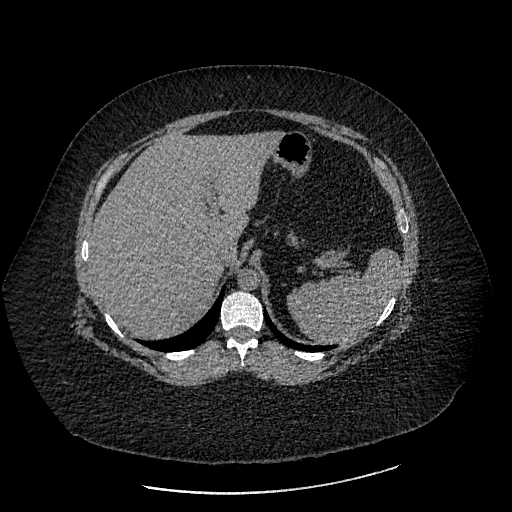
[im 693/788  soft-tissue]
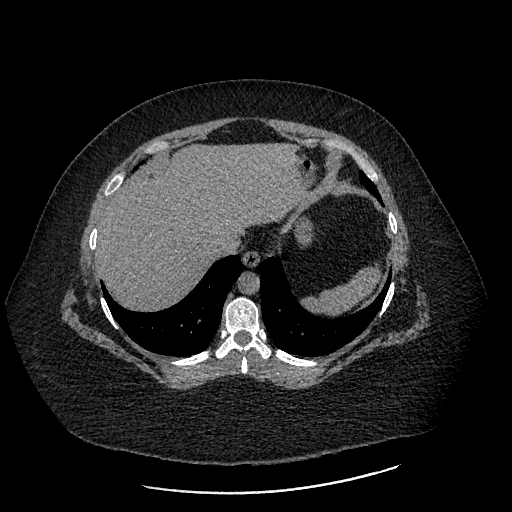
[im 756/788  soft-tissue]
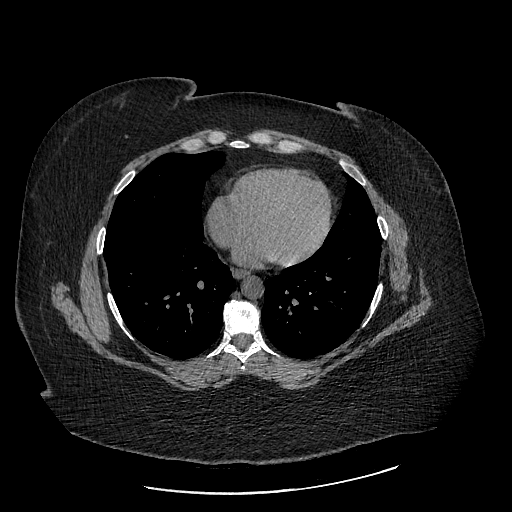

[Series 602: sag standard 2x2 · sagittal · 0.96mm/px · 3 of 229 slices shown]
[im 77/229  soft-tissue]
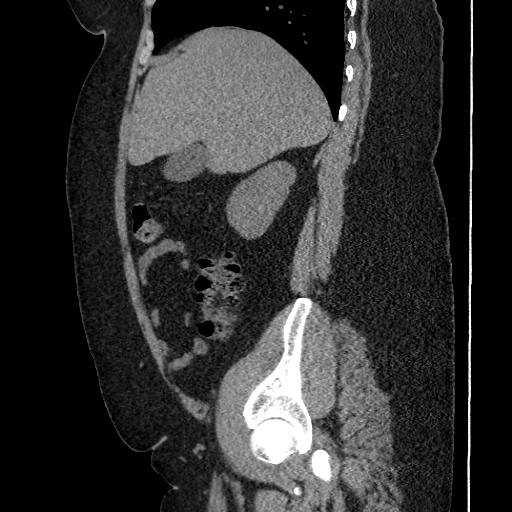
[im 102/229  soft-tissue]
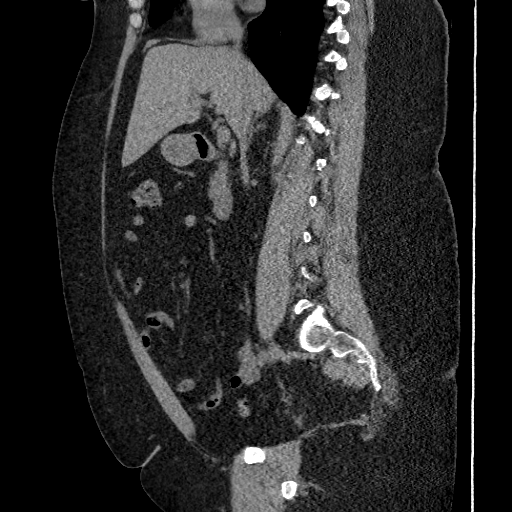
[im 127/229  soft-tissue]
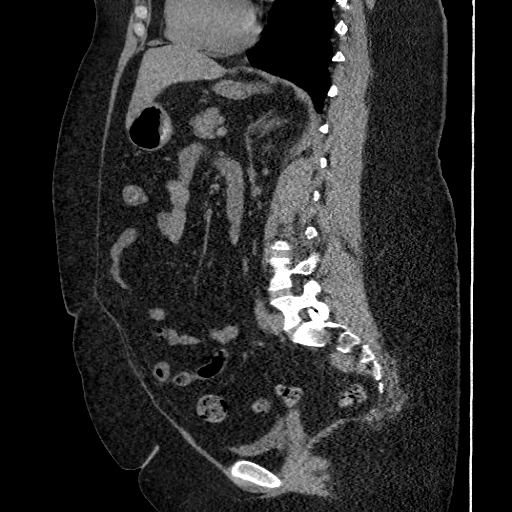

[17 of 46 positions shown; findings below may reference images not displayed]

FINDINGS: CT of the abdomen and pelvis performed with multiplanar reformations. No IV contrast was administered which degrades solid organ and vascular evaluation
Visualized lung bases are essentially clear.
Liver, gallbladder, pancreas, spleen, adrenal glands appear within normal limits. Underdistended urinary bladder. No ureteral calculi. There are multiple bilateral sub-6 mm nonobstructing renal calculi. No hydronephrosis. Prior hysterectomy.
No free intraperitoneal fluid. No bowel wall thickening or obstruction. Normal appendix. Normal caliber aorta. No lymphadenopathy. Mild lumbar spondylosis
IMPRESSION: *  Nephrolithiasis. No ureteral calculi or hydronephrosis.
Is the patient pregnant?
No

## 2023-09-22 IMAGING — DX XR CHEST 1 VIEW
1 series · 1 of 1 positions shown · non-contrast
Comparison: 01/16/2022.

FINAL REPORT:
EXAM: Portable chest x-ray
INDICATION: chest pain. . .
TECHNIQUE: Single, portable, AP digital radiograph of the chest.

[AP]
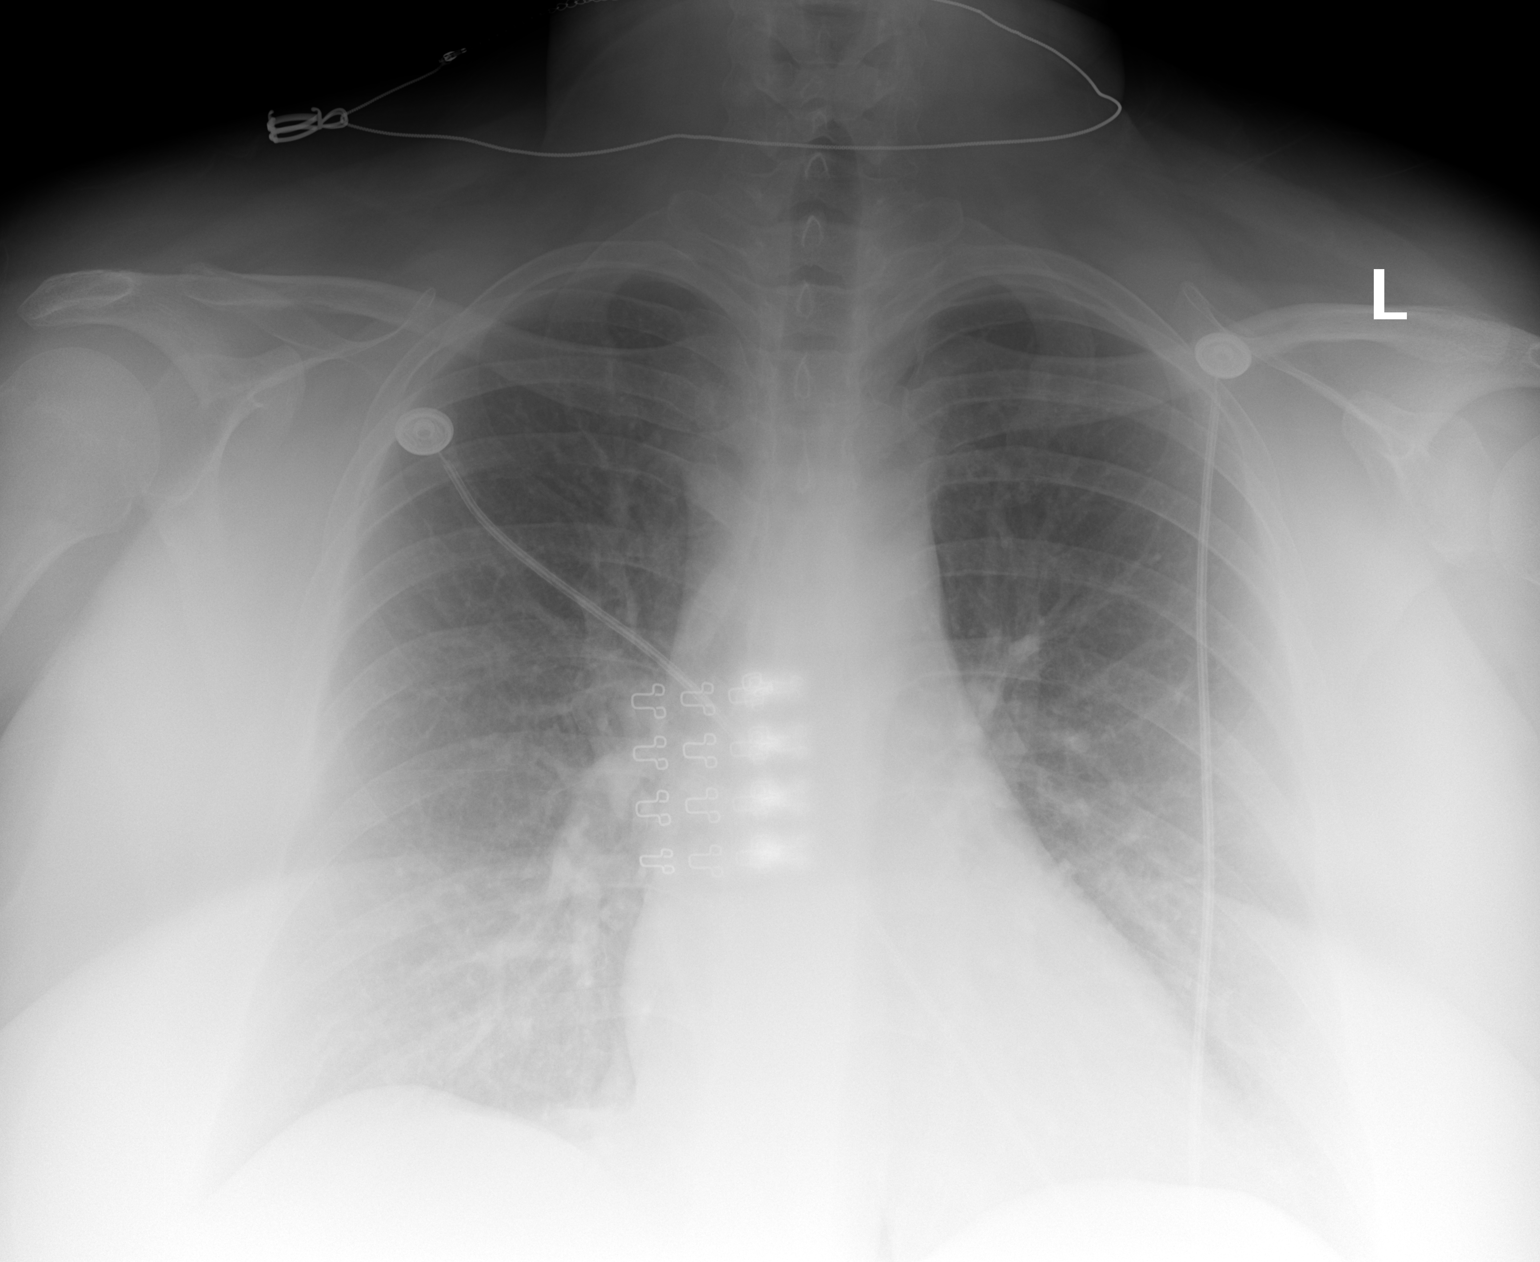

[1 of 1 positions shown; findings below may reference images not displayed]

FINDINGS: Hazy opacification of the lower lungs limits evaluation, consistent with summation artifact. No focal consolidation, pleural effusion, or pneumothorax. Cardiomediastinal silhouette is unremarkable. No pulmonary edema pattern.
IMPRESSION: No features of an acute intrathoracic process.
Is the patient pregnant?
No

## 2023-09-25 IMAGING — CT CT CHEST PULMONARY EMBOLISM WITH IV CONTRAST
2 of 6 series · 19 of 36 positions shown · non-contrast
Comparison: None provided.

Pt c/o Chest pain
FINAL REPORT:
EXAM:
CTA Chest with Intravenous Contrast for PE evaluation
CLINICAL HISTORY: Pt c/o Chest pain Pulmonary embolism (PE) suspected, high prob
TECHNIQUE: Axial CTA images of the chest with intravenous contrast using a pulmonary embolism protocol. Multiplanar reconstructed images were created and reviewed. 3-D MIP reformatted images also reviewed.
All CT scans at this facility use dose modulation, iterative reconstruction, and/or weight-based dosing when appropriate to reduce radiation dose to as low as reasonably achievable.

[Series 4: pe chest thin · axial · 0.89mm/px · z∈[-337,-37]mm · 16 of 544 slices shown]
[im 32/544  lung]
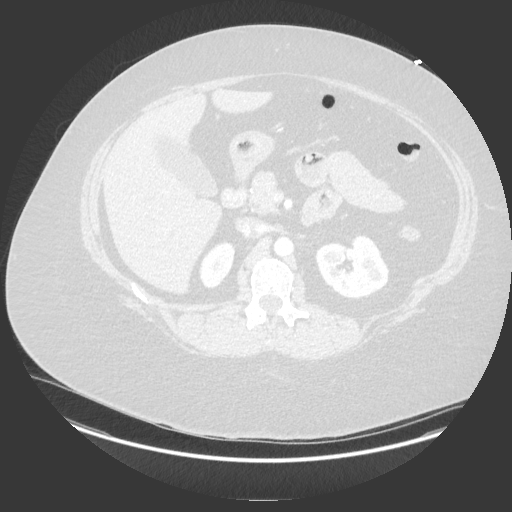
[im 64/544  mediastinal]
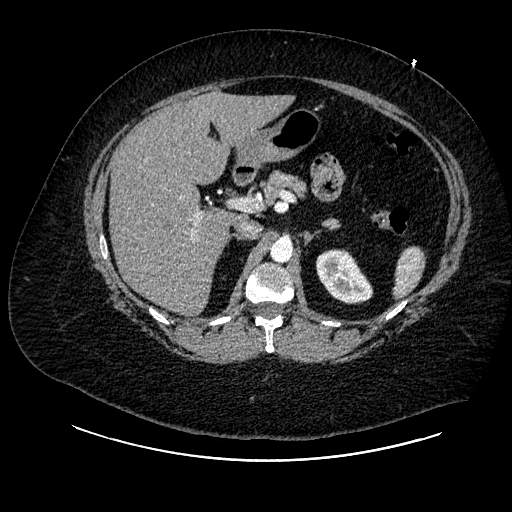
[im 96/544  lung]
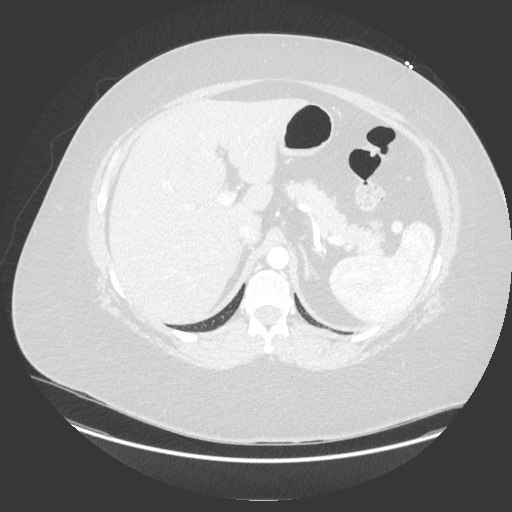
[im 128/544  mediastinal]
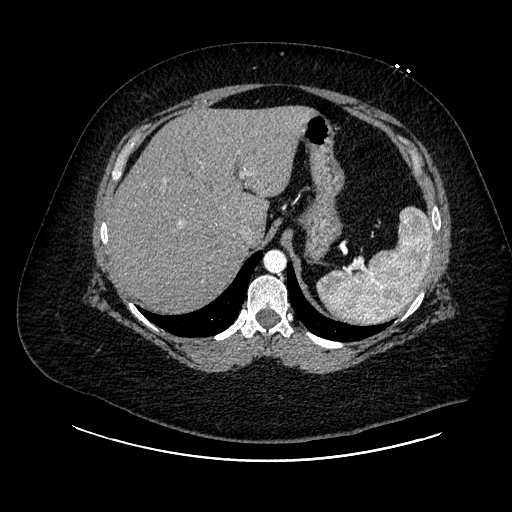
[im 160/544  lung]
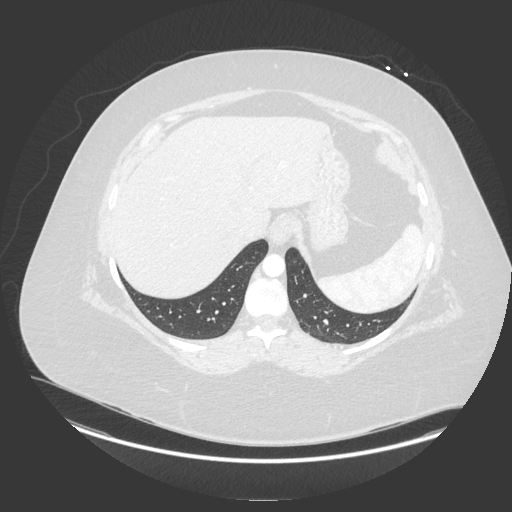
[im 192/544  mediastinal]
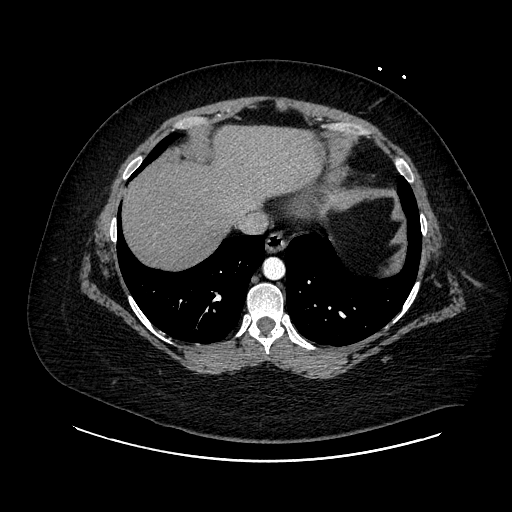
[im 224/544  lung]
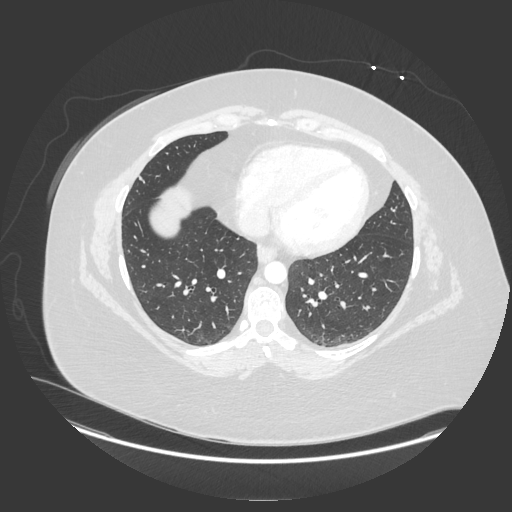
[im 256/544  mediastinal]
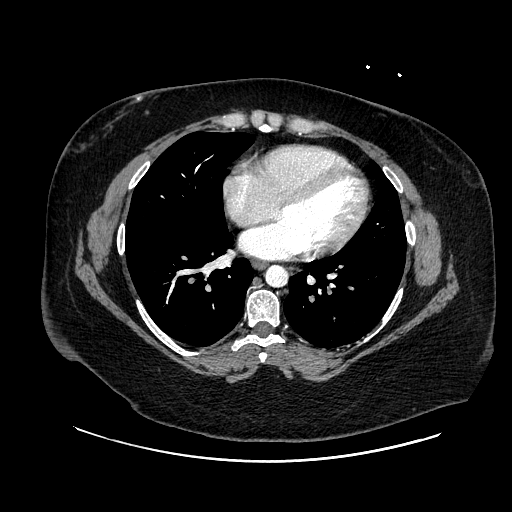
[im 288/544  lung]
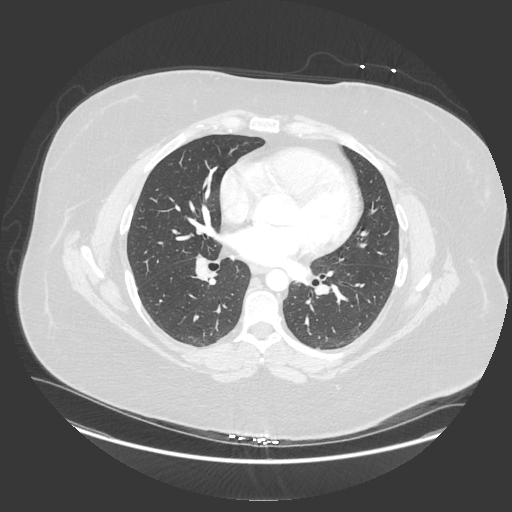
[im 320/544  mediastinal]
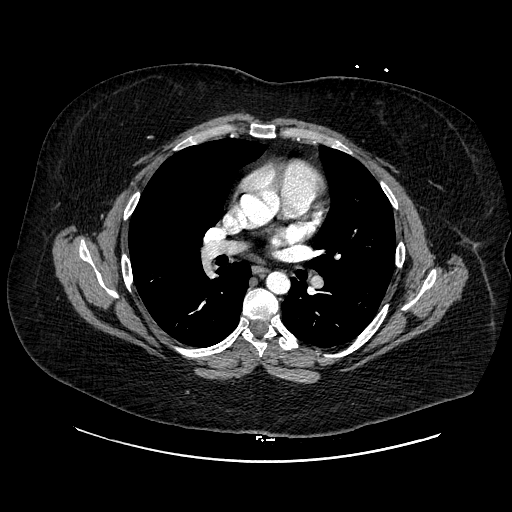
[im 352/544  lung]
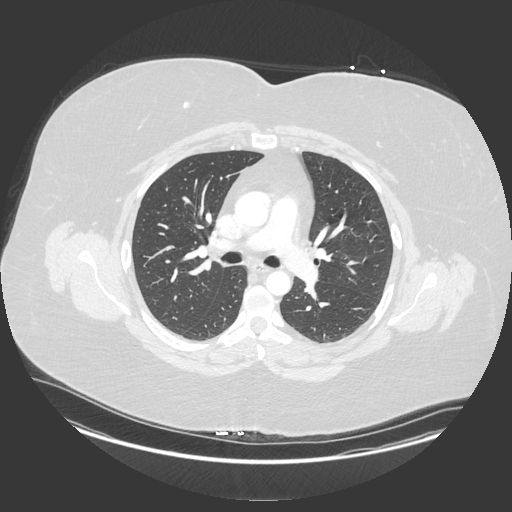
[im 384/544  mediastinal]
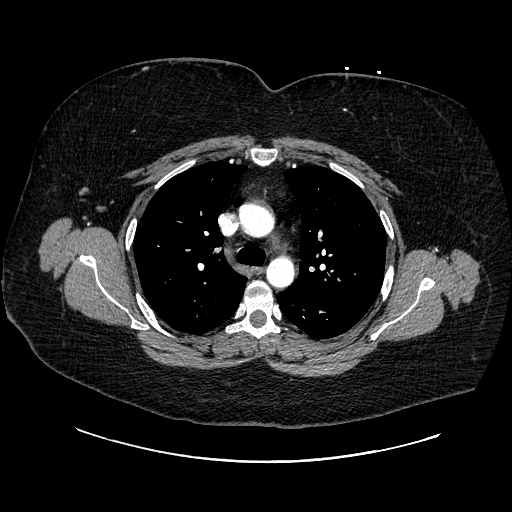
[im 416/544  lung]
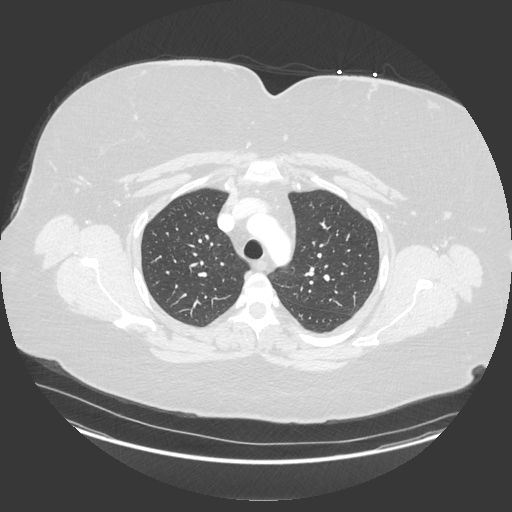
[im 448/544  mediastinal]
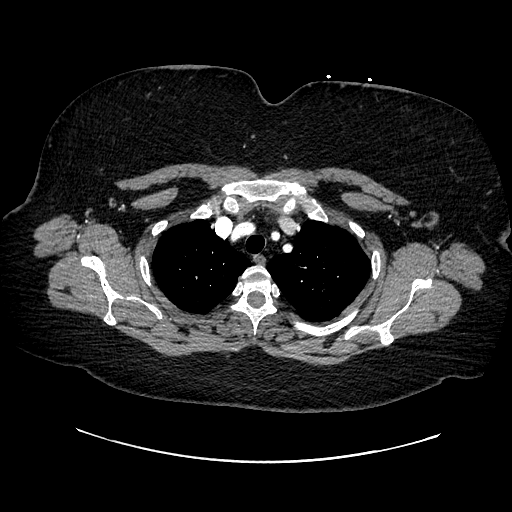
[im 480/544  lung]
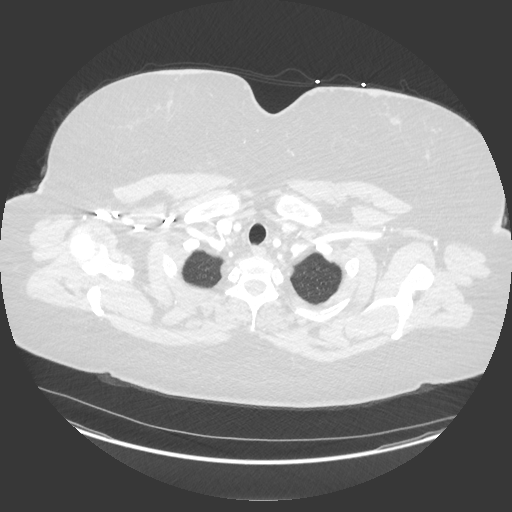
[im 512/544  mediastinal]
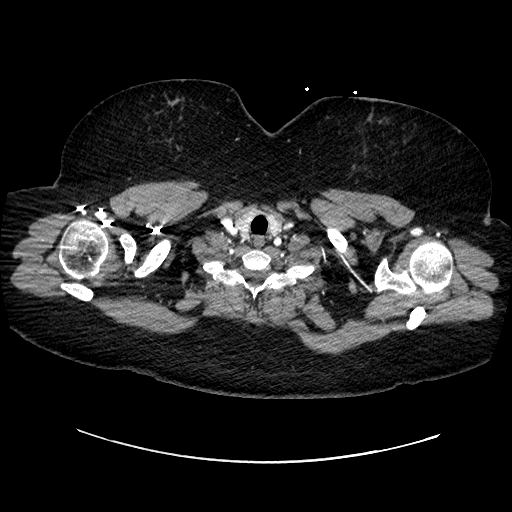

[Series 601: sag standard 2x2 · sagittal · 0.89mm/px · 3 of 229 slices shown]
[im 46/229  mediastinal]
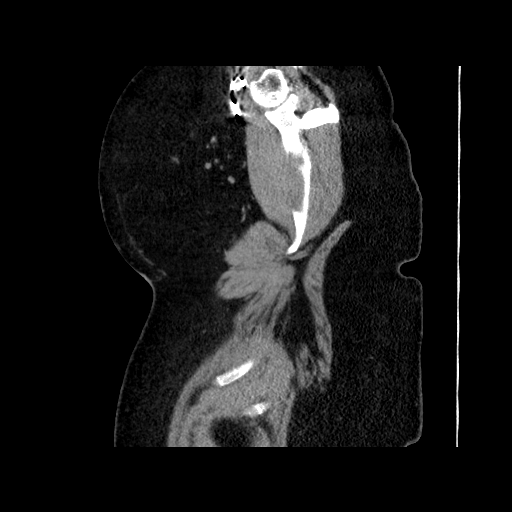
[im 92/229  mediastinal]
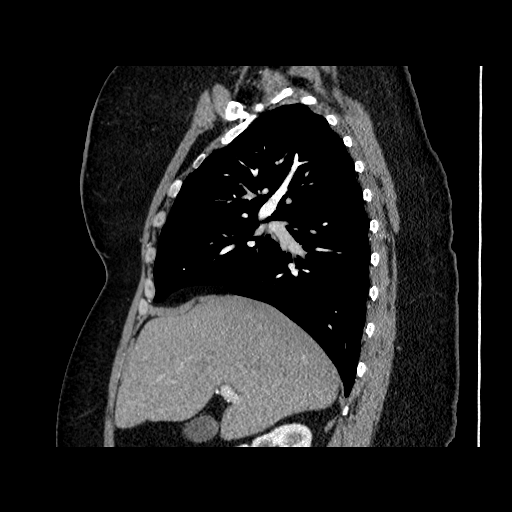
[im 137/229  mediastinal]
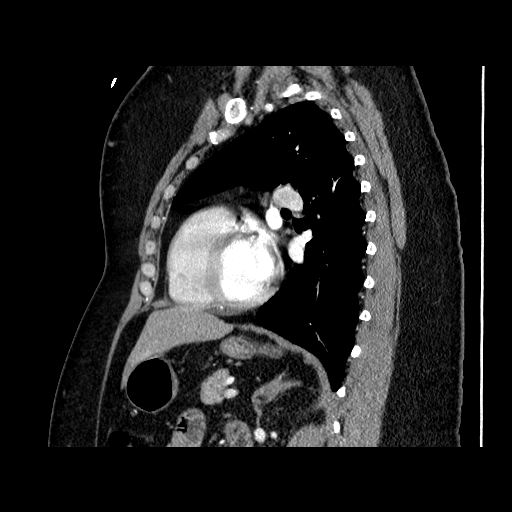

[19 of 36 positions shown; findings below may reference images not displayed]

FINDINGS: PULMONARY ARTERIES:
No evidence of central or segmental pulmonary embolism is seen.
AORTA:
There is no evidence for aneurysm or dissection of the thoracic aorta.
LUNGS:
The lungs appear clear.
PLEURAL SPACES:
No pleural effusion seen. No pneumothorax evident.
HEART:
Heart size is within normal limits. No significant pericardial effusion. There are no significant calcifications in the coronary arteries.
LYMPH NODES:
No lymphadenopathy is evident.
BONES:
No focal osseous abnormality or acute fracture.
UPPER ABDOMEN:
Nonobstructive right renal stone
IMPRESSION: No evidence of pulmonary thromboembolism.
Is the patient pregnant?
No

## 2023-09-25 IMAGING — CR XR CHEST 1 VIEW
1 series · 1 of 1 positions shown · non-contrast
Comparison: None provided.

FINAL REPORT:
EXAM:
CR Chest, 1  View.
CLINICAL HISTORY: dyspnea

[AP]
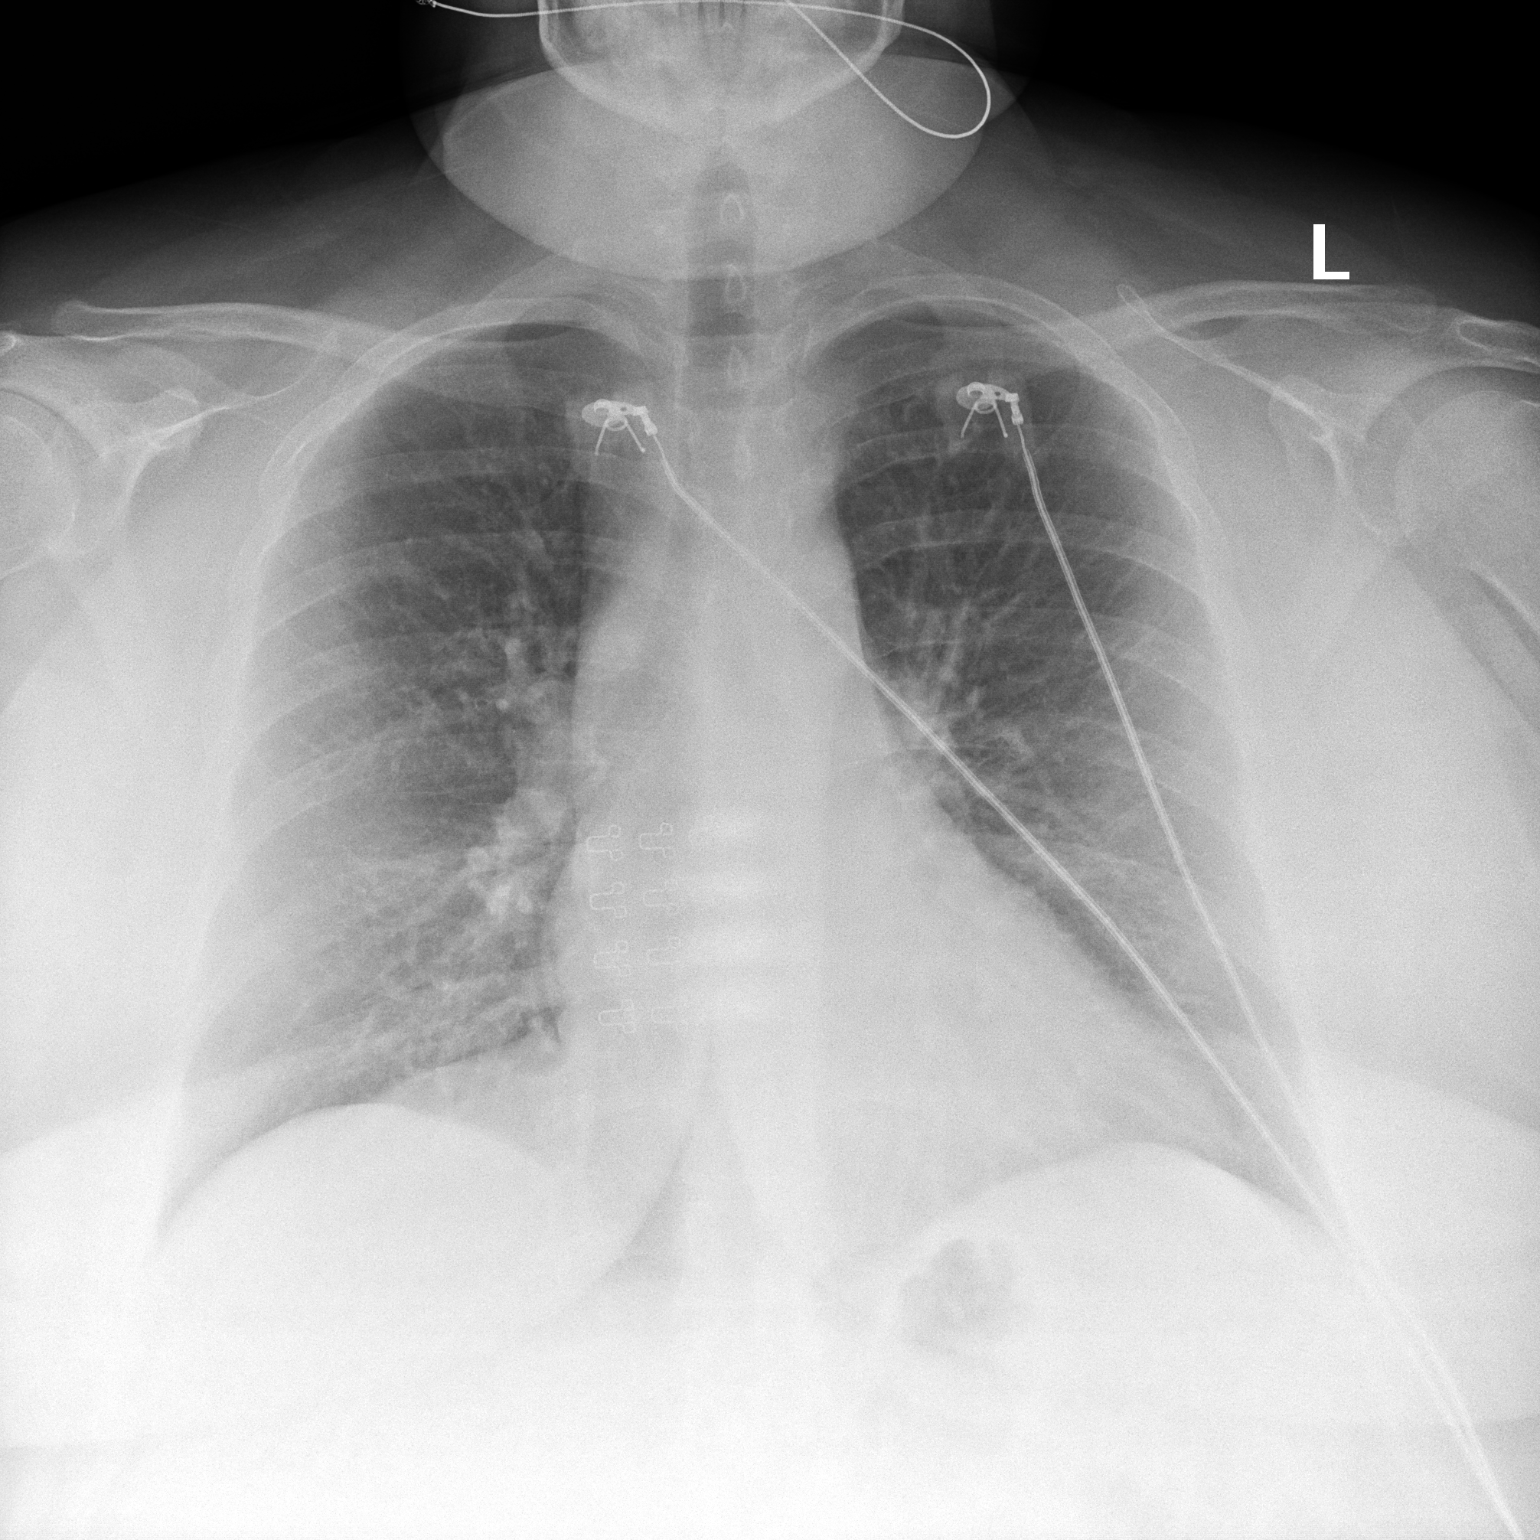

[1 of 1 positions shown; findings below may reference images not displayed]

FINDINGS: LUNGS:
No consolidation
PLEURAL SPACES:
No pleural effusion or pneumothorax
MEDIASTINUM:
Cardiac size and mediastinal contours within normal limits
BONES:
No acute osseous abnormality
IMPRESSION: No acute cardiopulmonary process
Is the patient pregnant?
No

## 2023-09-25 IMAGING — CT CT ABDOMEN PELVIS WITH AND WITHOUT CONTRAST
5 of 7 series · 14 of 46 positions shown, 19 images · non-contrast
Comparison: 05/10/2023
Axial spiral CT acquisition was performed from the lung bases through the pubic symphysis without and with IV contrast.

FINAL REPORT:
CT abdomen and pelvis:
CLINICAL INDICATION: Right upper quadrant pain

[Series 2: w/o · axial · non-contrast · 0.98mm/px · z∈[-415,-233]mm · 2 of 219 slices shown, 5 images]
[im 73/219  soft-tissue]
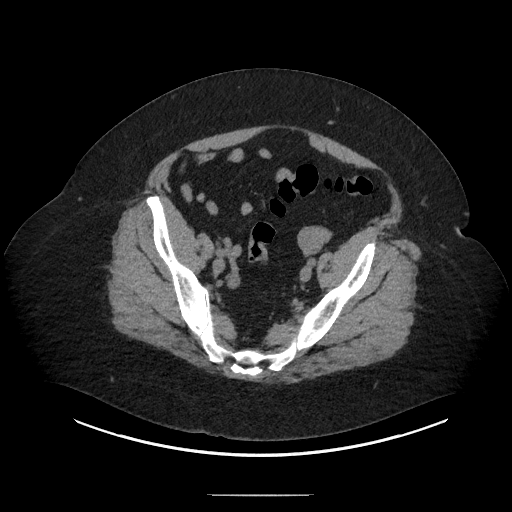
[im 73/219  lung]
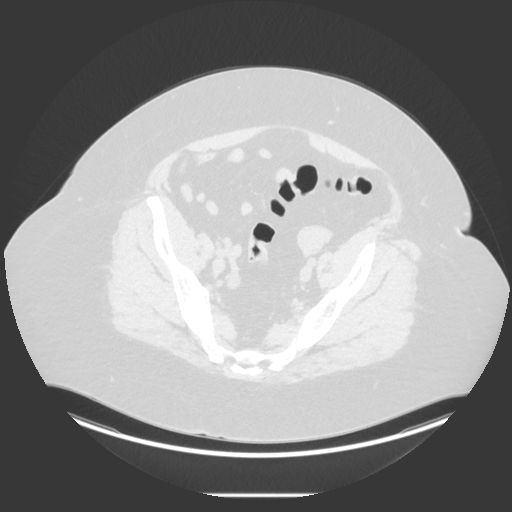
[im 73/219  bone]
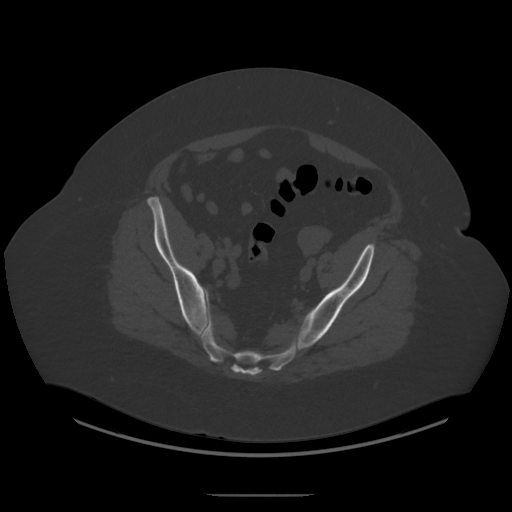
[im 146/219  soft-tissue]
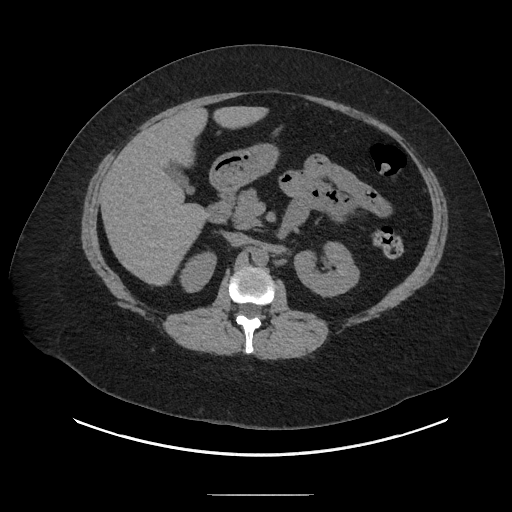
[im 146/219  lung]
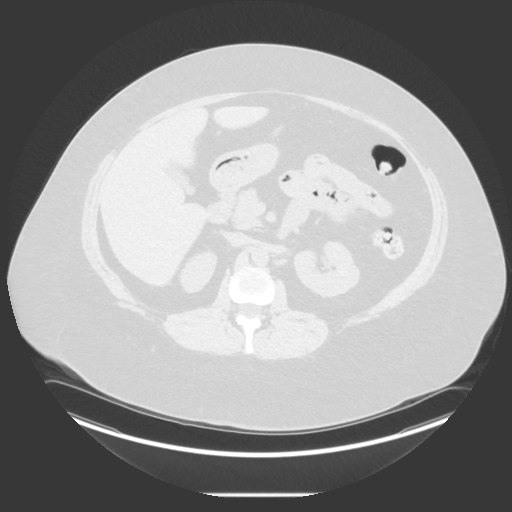

[Series 301: abd/pel dlir · axial · 0.98mm/px · z∈[-415,-233]mm · 2 of 219 slices shown]
[im 73/219  soft-tissue]
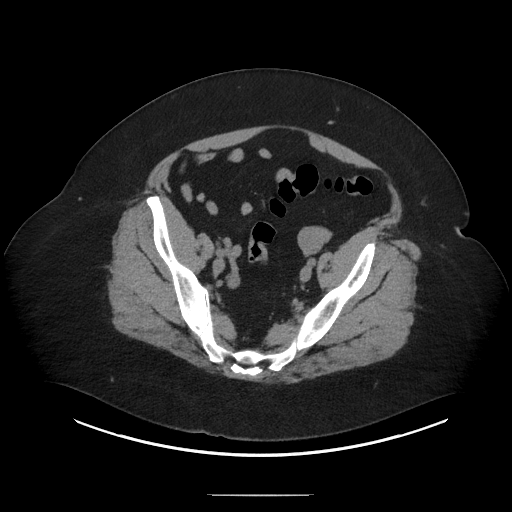
[im 146/219  soft-tissue]
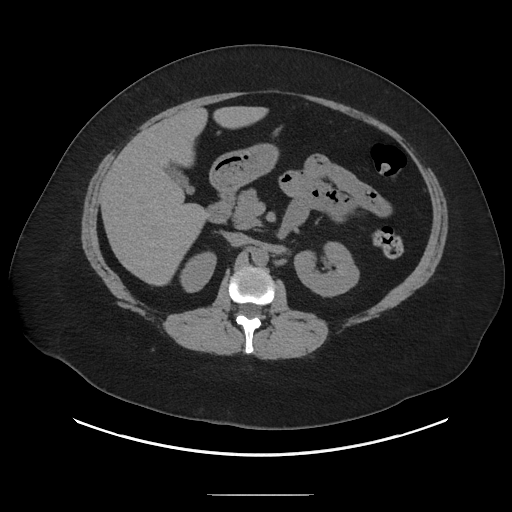

[Series 302: abd/pel thins · axial · 0.98mm/px · z∈[-552,-230]mm · 6 of 861 slices shown]
[im 58/861  soft-tissue]
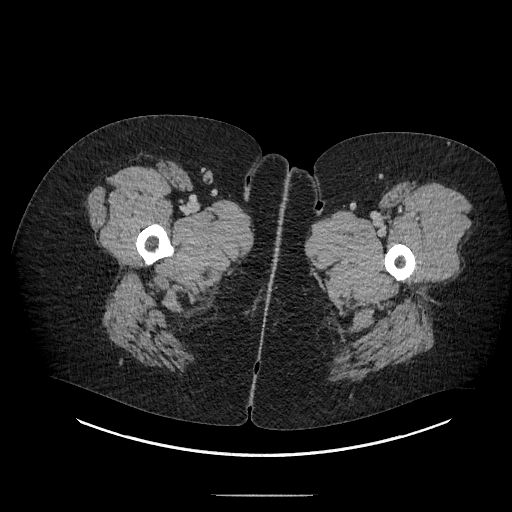
[im 173/861  soft-tissue]
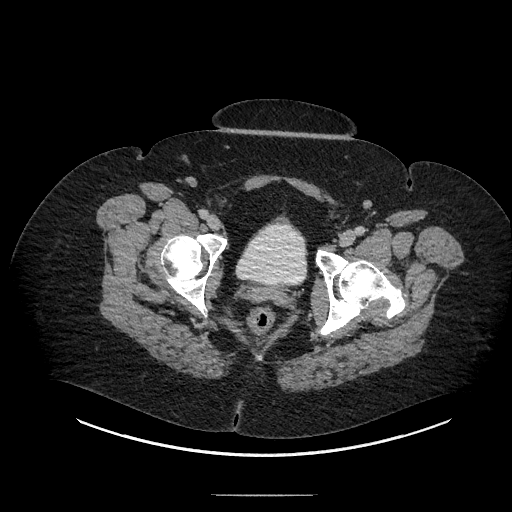
[im 287/861  soft-tissue]
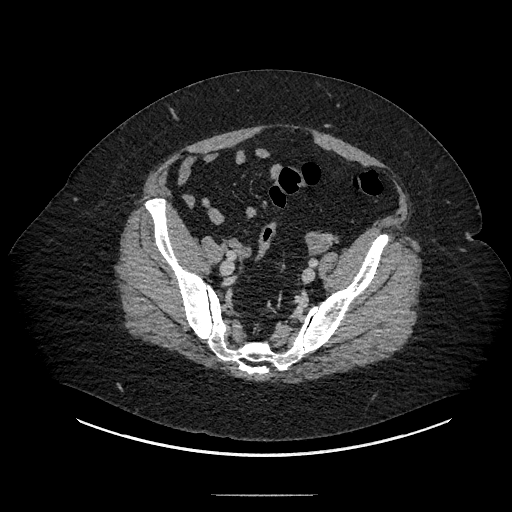
[im 402/861  soft-tissue]
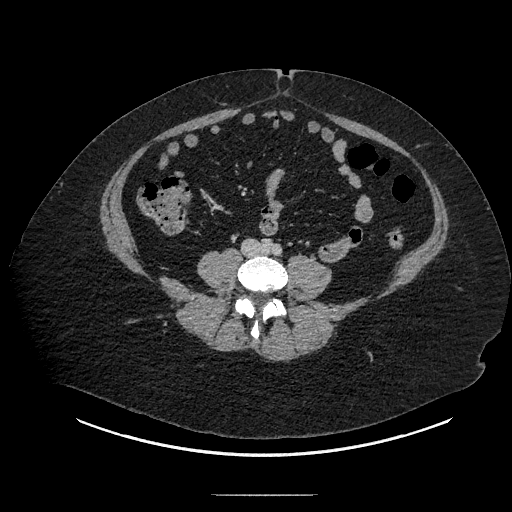
[im 459/861  soft-tissue]
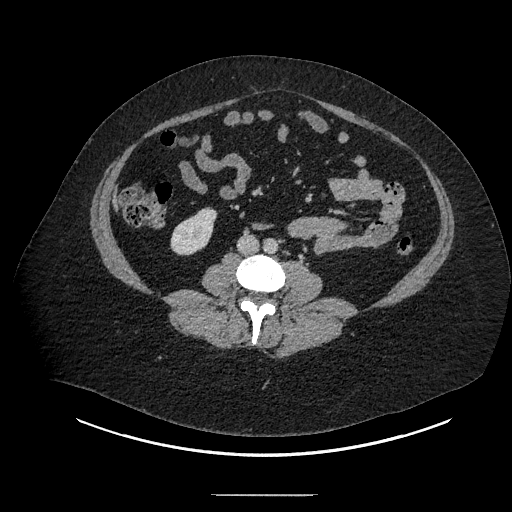
[im 574/861  soft-tissue]
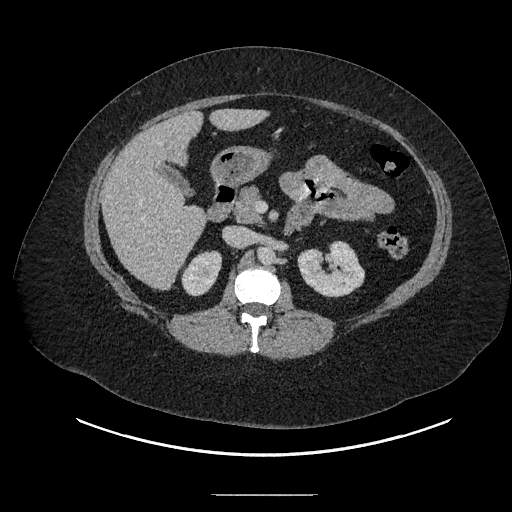

[Series 601: coronal · coronal · 1.05mm/px · 3 of 233 slices shown, 4 images]
[im 78/233  soft-tissue]
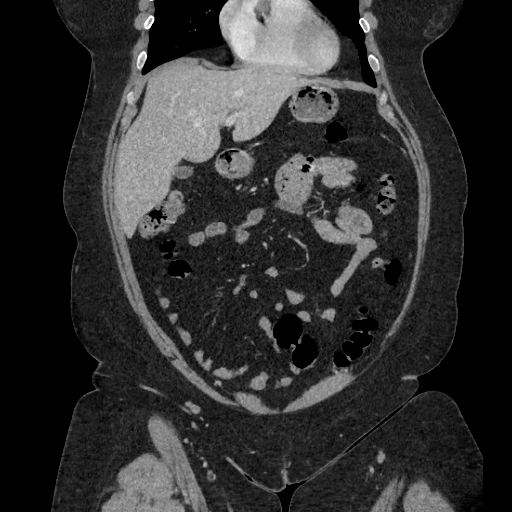
[im 104/233  soft-tissue]
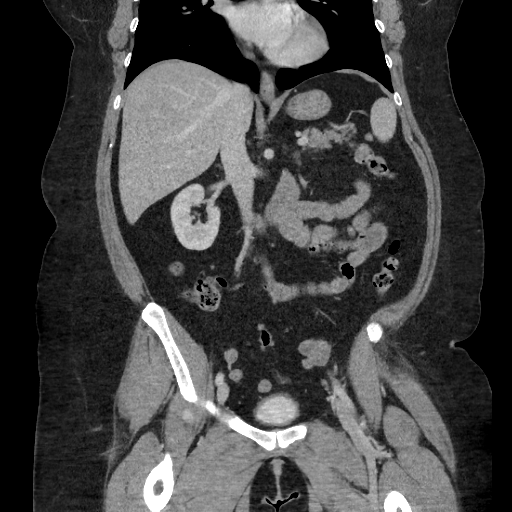
[im 104/233  bone]
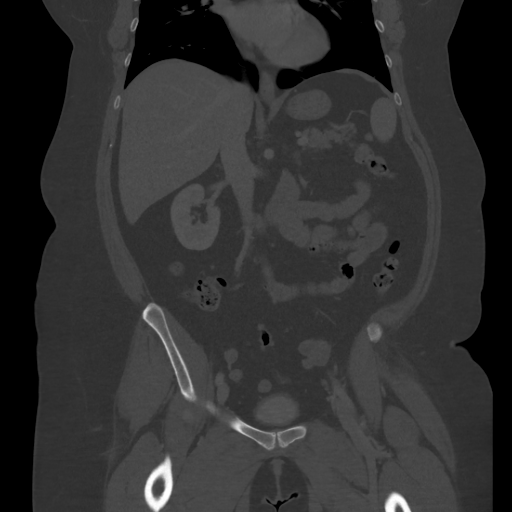
[im 129/233  soft-tissue]
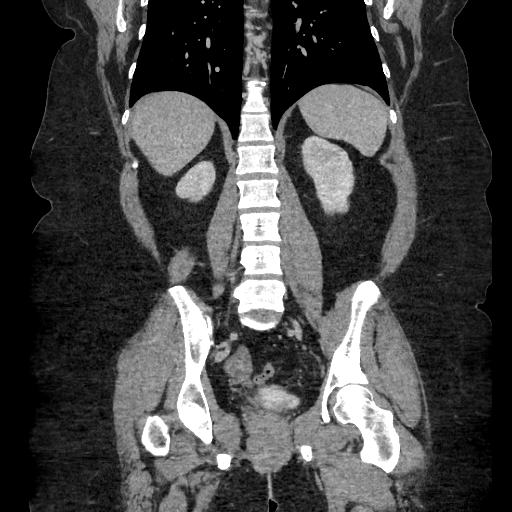

[Series 602: sagittal · sagittal · 1.05mm/px · 1 of 246 slices shown, 2 images]
[im 82/246  soft-tissue]
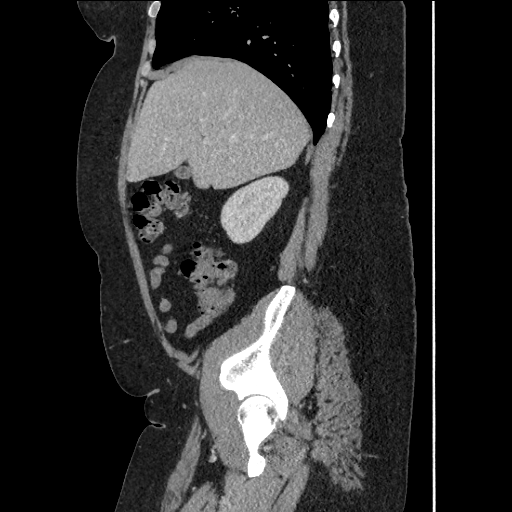
[im 82/246  bone]
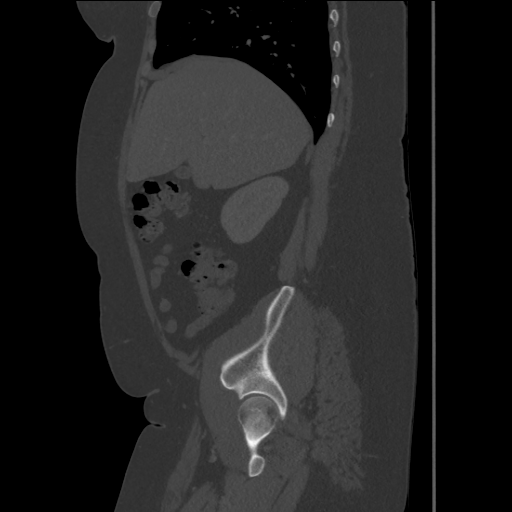

[14 of 46 positions shown; findings below may reference images not displayed]

All CT scans at this facility use iterative reconstruction technique, dose modulation and/or weight based dosing when appropriate to reduce radiation dose to as low as reasonably achievable.
Sections through the lung bases show no acute infiltrate.
Noncontrast images show normal hepatic parenchymal attenuation. There is contrast in the renal collecting systems related to previous CT chest. No vascular calcifications are seen. The skeletal structures appear intact.
Postcontrast images homogeneous enhancement of the liver and spleen. The gallbladder, adrenal glands, and pancreas appear unremarkable. The kidneys show symmetric contrast excretion. There is no hydronephrosis. No retroperitoneal adenopathy is seen. The vascular structures appear unremarkable. There is no free intraperitoneal air or bowel wall thickening. The uterus is surgically absent. No adnexal masses demonstrated. No bladder wall thickening is seen. There is no pelvic or inguinal adenopathy.
IMPRESSION: No acute process in the abdomen or pelvis.
Is the patient pregnant?
No

## 2023-10-01 IMAGING — MR MRI CERVICAL SPINE WITH AND WITHOUT CONTRAST
12 series · 48 of 48 positions shown · IV contrast (agent unspecified)
Comparison: CT cervical spine from 09/28/2019

FINAL REPORT:
MRI CERVICAL SPINE WITH AND WITHOUT CONTRAST
INDICATION: thoracic back pain, fever
TECHNIQUE: Multisequence magnetic resonance imaging of the cervical spine without and with contrast.

[Series 1: survey · coronal · 1.7mm · 1.67mm/px · 16 of 96 slices shown]
[im 1/96]
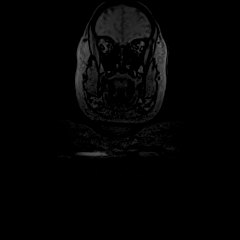
[im 7/96]
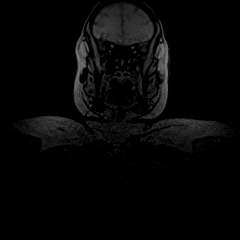
[im 13/96]
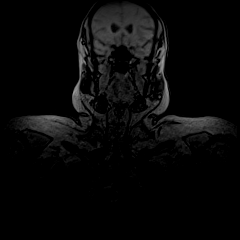
[im 20/96]
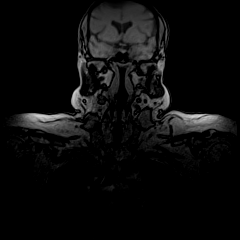
[im 26/96]
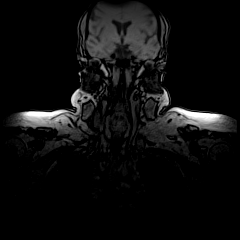
[im 32/96]
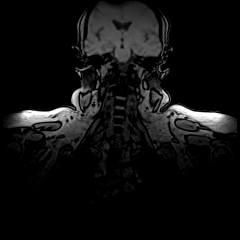
[im 39/96]
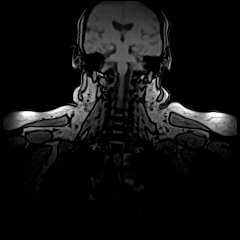
[im 45/96]
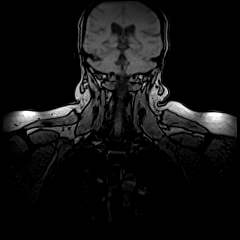
[im 51/96]
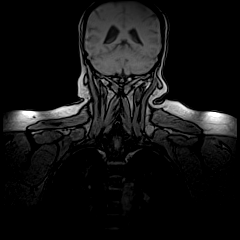
[im 58/96]
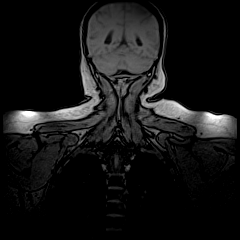
[im 64/96]
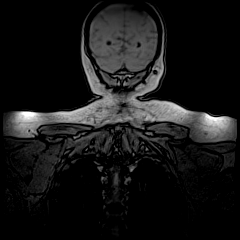
[im 70/96]
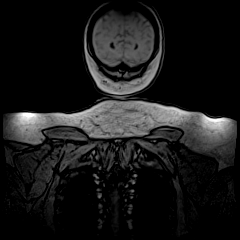
[im 77/96]
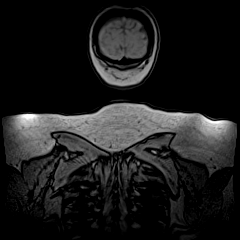
[im 83/96]
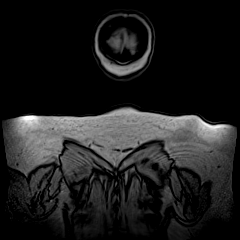
[im 89/96]
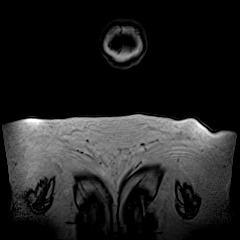
[im 96/96]
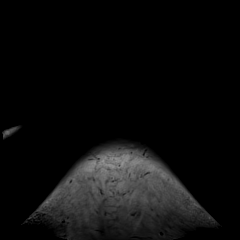

[Series 3: survey_mpr_sag · sagittal · 1.7mm · 1.67mm/px · 3 of 15 slices shown]
[im 1/15]
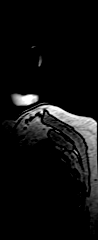
[im 8/15]
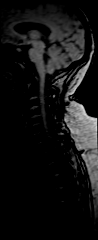
[im 15/15]
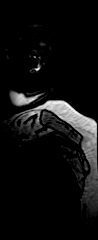

[Series 4: survey_mpr_(person_name) · axial · 1.7mm · 1.67mm/px · 1 of 7 slices shown]
[im 1/7]
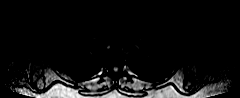

[Series 5: T2 · sagittal · 3.0mm · 0.69mm/px · 2 of 15 slices shown (1 of 2)]
[im 1/15]
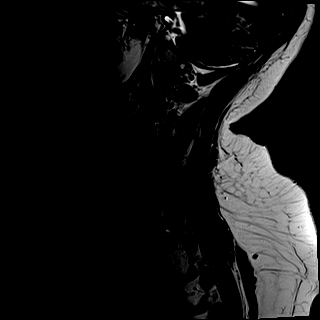
[im 15/15]
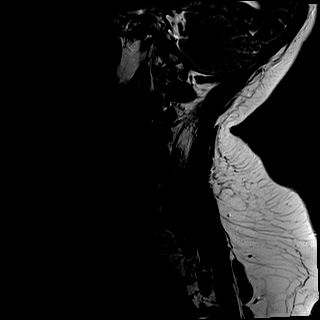

[Series 6: T1 · sagittal · 3.0mm · 0.69mm/px · 2 of 15 slices shown (1 of 2)]
[im 1/15]
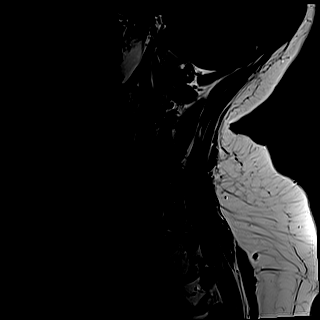
[im 15/15]
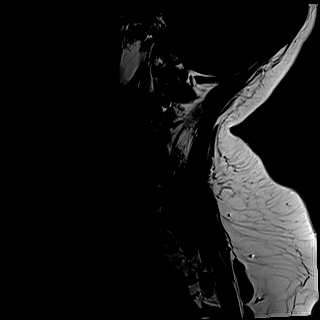

[Series 7: STIR · sagittal · 3.0mm · 0.86mm/px · 2 of 15 slices shown]
[im 1/15]
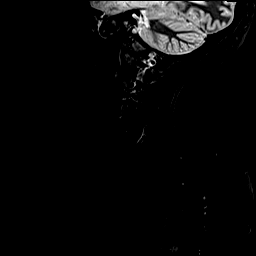
[im 15/15]
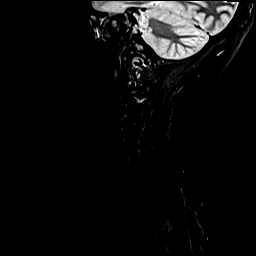

[Series 8: GRE · axial · 4.0mm · 0.94mm/px · z∈[-68,+47]mm · 4 of 28 slices shown]
[im 1/28]
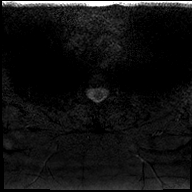
[im 10/28]
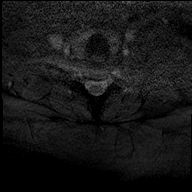
[im 19/28]
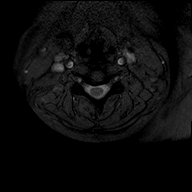
[im 28/28]
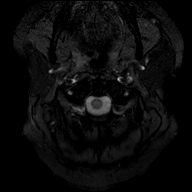

[Series 9: T2 · axial · 3.0mm · 0.70mm/px · z∈[-63,+50]mm · 6 of 40 slices shown (2 of 2)]
[im 1/40]
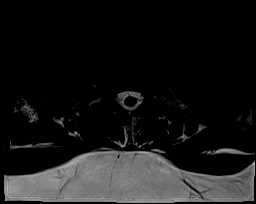
[im 8/40]
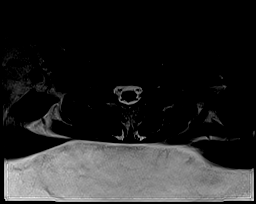
[im 16/40]
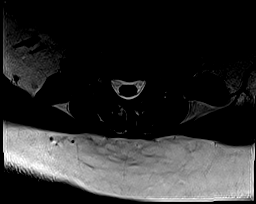
[im 24/40]
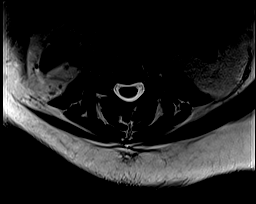
[im 32/40]
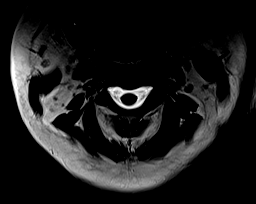
[im 40/40]
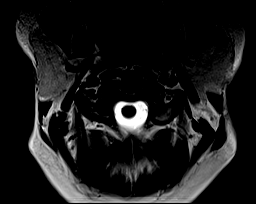

[Series 10: T1 · axial · 4.0mm · 0.35mm/px · z∈[-68,+47]mm · 4 of 28 slices shown (2 of 2)]
[im 1/28]
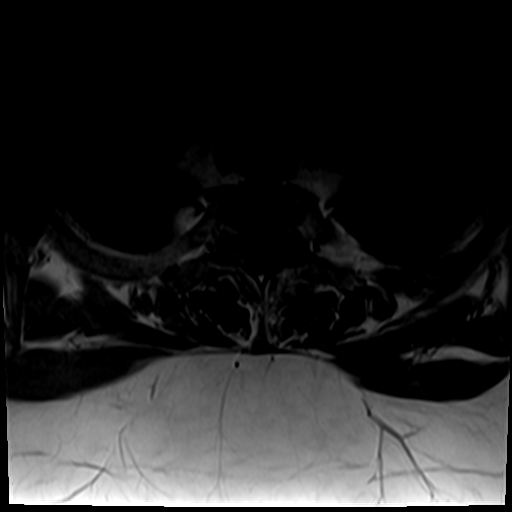
[im 10/28]
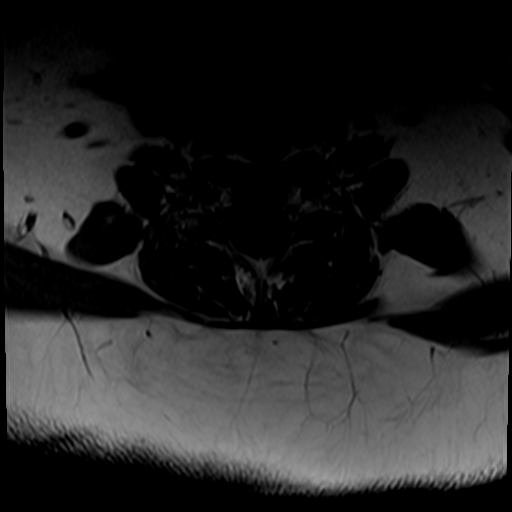
[im 19/28]
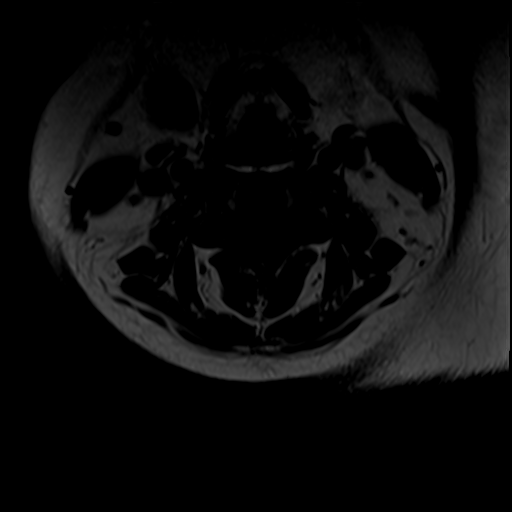
[im 28/28]
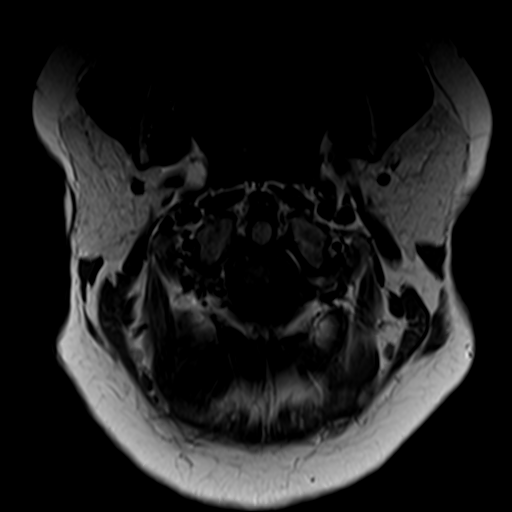

[Series 11: T1 post-contrast · sagittal · 3.0mm · 0.69mm/px · 2 of 15 slices shown (1 of 2)]
[im 1/15]
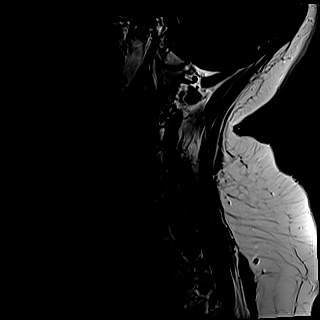
[im 15/15]
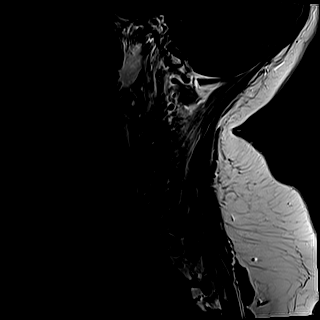

[Series 12: T1 post-contrast · axial · 4.0mm · 0.35mm/px · z∈[-68,+47]mm · 4 of 28 slices shown (2 of 2)]
[im 1/28]
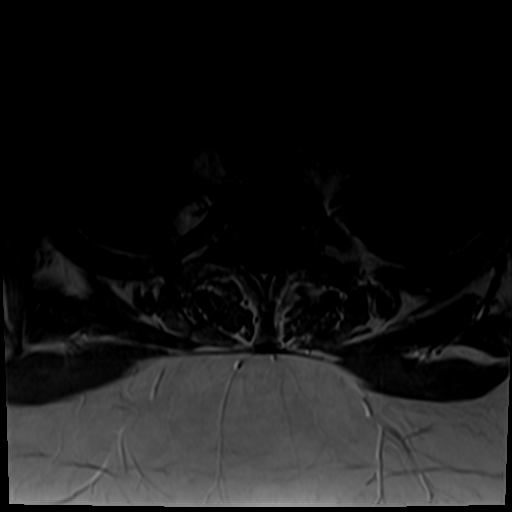
[im 10/28]
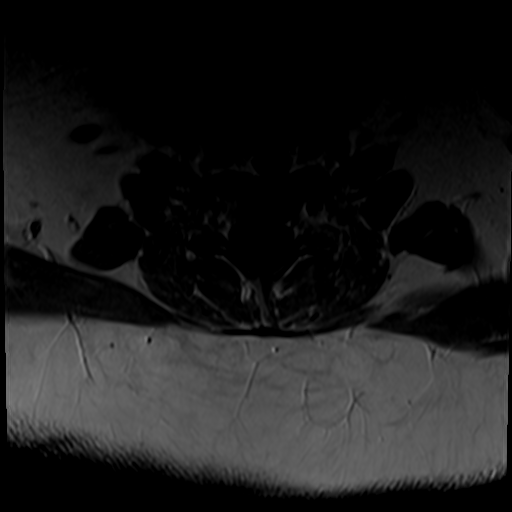
[im 19/28]
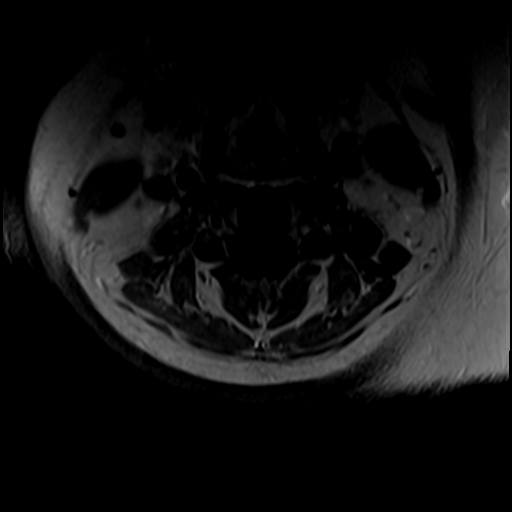
[im 28/28]
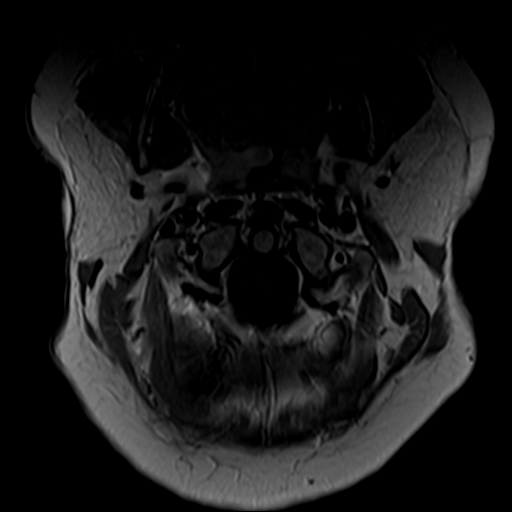

[Series 13: T1 fat-sat post-contrast · sagittal · 3.0mm · 0.69mm/px · 2 of 15 slices shown]
[im 1/15]
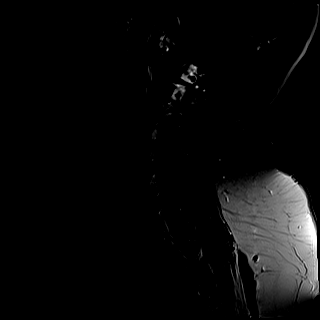
[im 15/15]
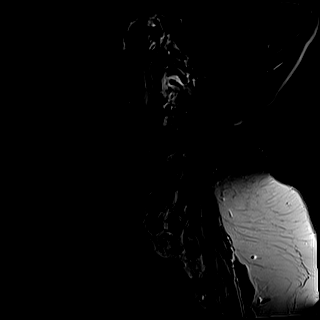

[48 of 48 positions shown; findings below may reference images not displayed]

FINDINGS: Alignment: Normal
Vertebral Bodies: Normal in height
Marrow Signal: Expected
Intervertebral Discs: Mild multilevel disc space height loss.
Spinal Cord: Normal in signal intensity.
Included Intracranial Structures: Normal
Paraspinal Soft Tissues: Normal
Individual Levels:
C1-C2: Normal
C2-C3: Normal
C3-C4: Minimal posterior disc osteophyte complex. Minimal spinal canal stenosis. No significant neuroforaminal narrowing.
C4-C5: Minimal posterior disc osteophyte complex. Minimal spinal canal stenosis. No significant neuroforaminal narrowing.
C5-C6: Minimal posterior disc osteophyte complex. Minimal spinal canal stenosis. No significant neural foraminal narrowing.
C6-C7: Normal
C7-T1: Normal
No abnormal enhancement.
IMPRESSION: 1.  No acute findings in the cervical spine.
2.  Minimal degenerative changes of the cervical spine.
Is the patient pregnant?
Unknown

## 2023-10-01 IMAGING — MR MRI THORACIC SPINE WITH AND  WITHOUT CONTRAST
14 of 18 series · 38 of 48 positions shown · IV contrast (agent unspecified)
Comparison: CT chest from 09/25/2023..

The patient started to move towards the end of exam due to her being in so much pain. CE [DATE].
FINAL REPORT:
MRI THORACIC SPINE WITH AND  WITHOUT CONTRAST
INDICATION: thoracic back pain, fever.
TECHNIQUE: Multisequence multiplanar magnetic resonance imaging of the thoracic spine without and with contrast.

[Series 5: survey · coronal · 1.7mm · 1.67mm/px · 7 of 128 slices shown]
[im 1/128]
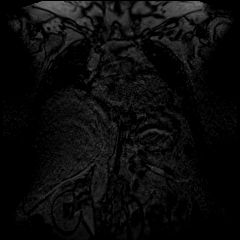
[im 22/128]
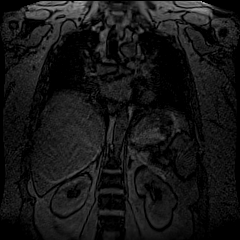
[im 43/128]
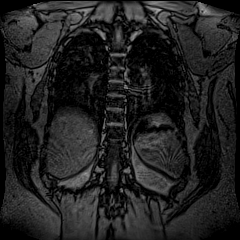
[im 64/128]
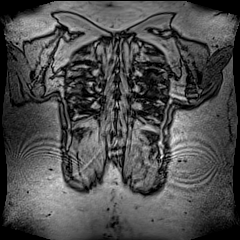
[im 85/128]
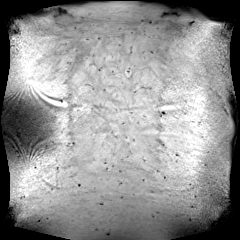
[im 106/128]
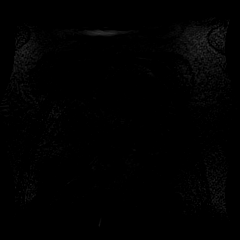
[im 128/128]
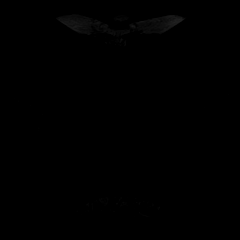

[Series 7: survey_mpr_sag · sagittal · 1.7mm · 1.67mm/px · 1 of 15 slices shown]
[im 1/15]
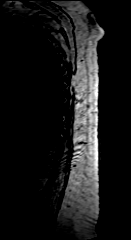

[Series 8: survey_mpr_(person_name) · axial · 1.7mm · 1.67mm/px · 1 of 7 slices shown]
[im 1/7]
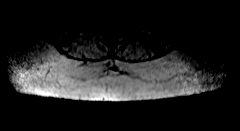

[Series 9: survey_comp · coronal · 1.7mm · 1.67mm/px · 7 of 116 slices shown]
[im 1/116]
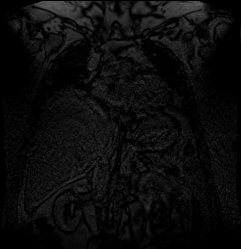
[im 20/116]
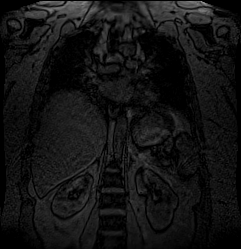
[im 39/116]
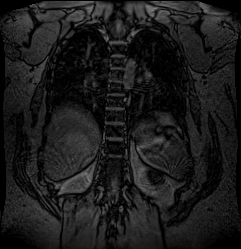
[im 58/116]
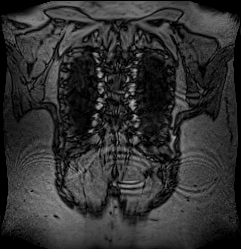
[im 77/116]
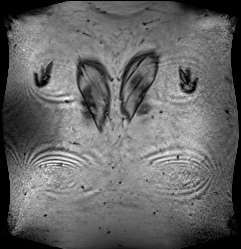
[im 96/116]
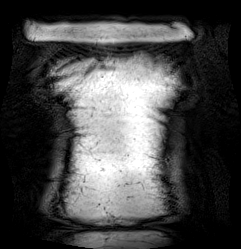
[im 116/116]
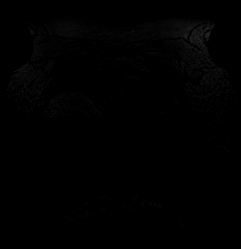

[Series 12: T2 · sagittal · 4.0mm · 0.76mm/px · 1 of 15 slices shown (1 of 3)]
[im 1/15]
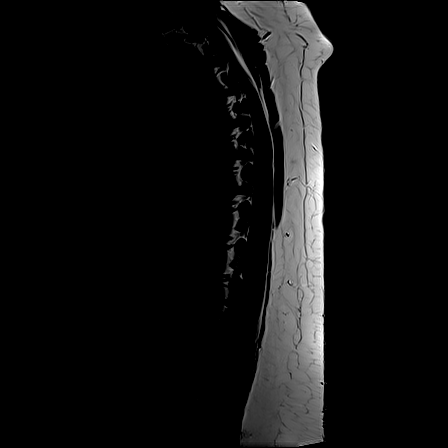

[Series 13: T1 · sagittal · 4.0mm · 1.06mm/px · 1 of 15 slices shown (1 of 3)]
[im 1/15]
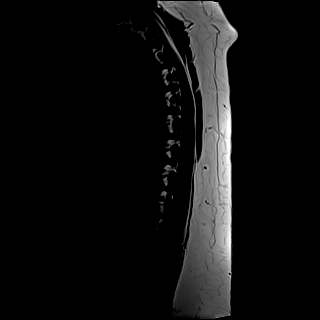

[Series 14: STIR · sagittal · 4.0mm · 0.89mm/px · 1 of 15 slices shown]
[im 1/15]
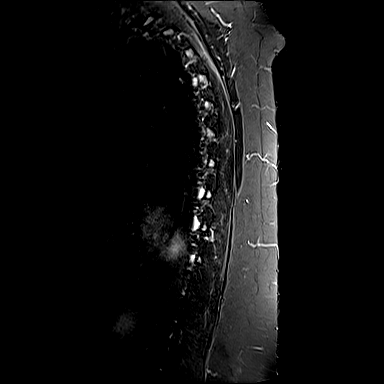

[Series 15: T2 · axial · 4.0mm · 0.78mm/px · z∈[-211,-43]mm · 3 of 40 slices shown (2 of 3)]
[im 1/40]
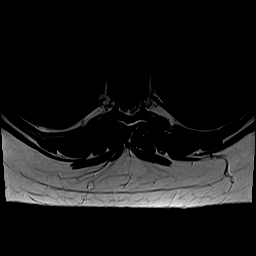
[im 20/40]
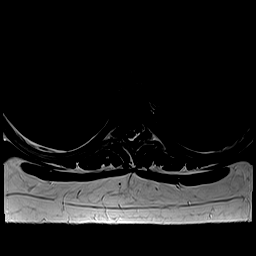
[im 40/40]
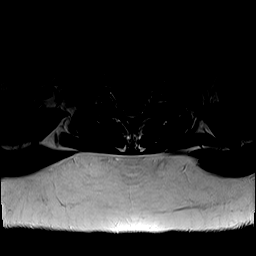

[Series 16: T2 · axial · 4.0mm · 0.78mm/px · z∈[-339,-170]mm · 3 of 40 slices shown (3 of 3)]
[im 1/40]
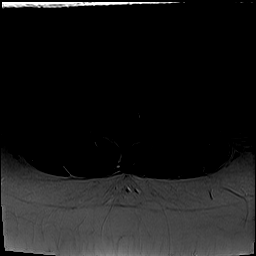
[im 20/40]
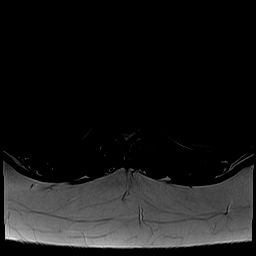
[im 40/40]
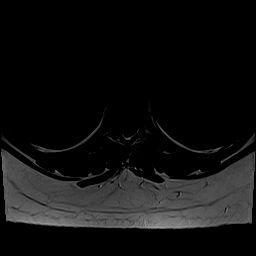

[Series 17: T1 · axial · non-contrast · 4.0mm · 0.39mm/px · z∈[-213,-46]mm · 3 of 40 slices shown (2 of 3)]
[im 1/40]
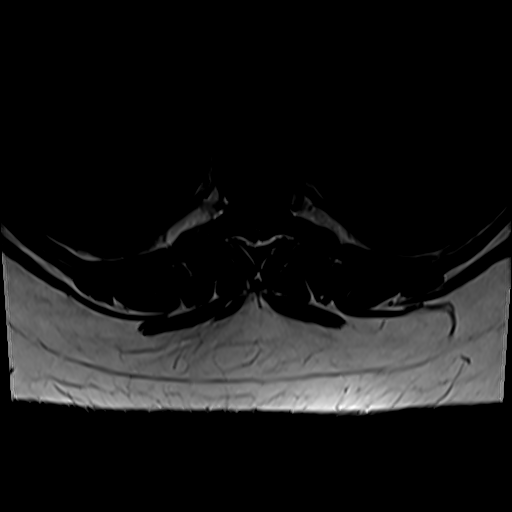
[im 20/40]
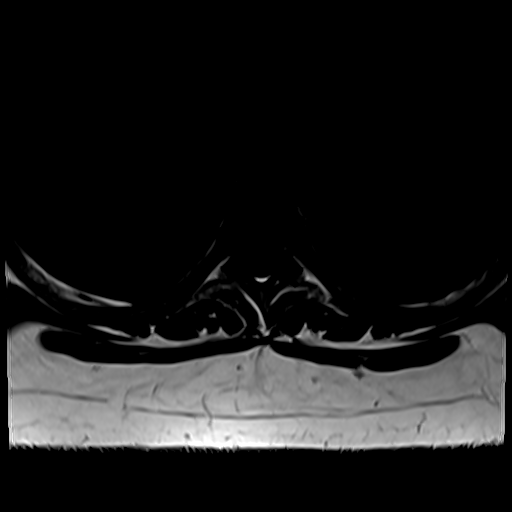
[im 40/40]
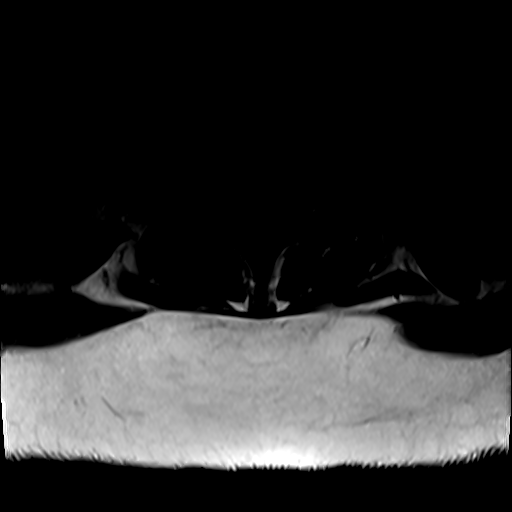

[Series 18: T1 · axial · non-contrast · 4.0mm · 0.39mm/px · z∈[-333,-164]mm · 3 of 40 slices shown (3 of 3)]
[im 1/40]
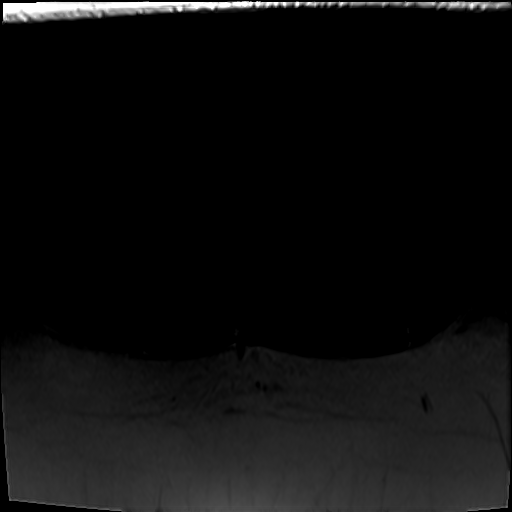
[im 20/40]
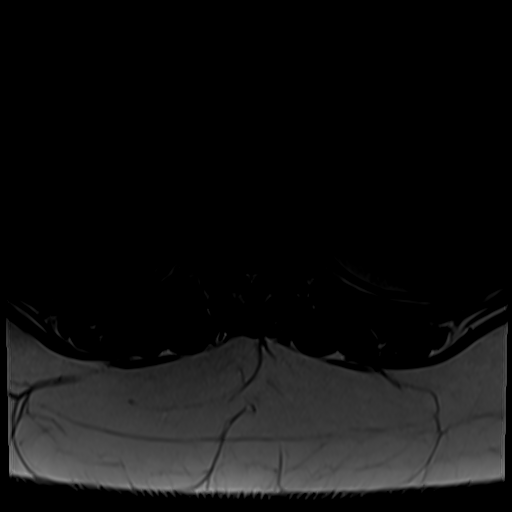
[im 40/40]
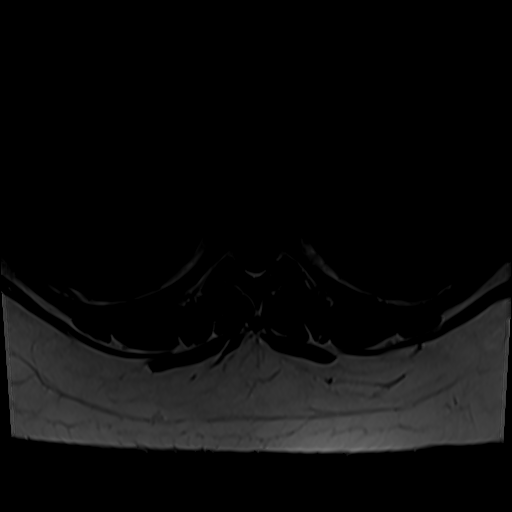

[Series 19: T1 fat-sat post-contrast · sagittal · 4.0mm · 1.06mm/px · 1 of 15 slices shown (1 of 3)]
[im 1/15]
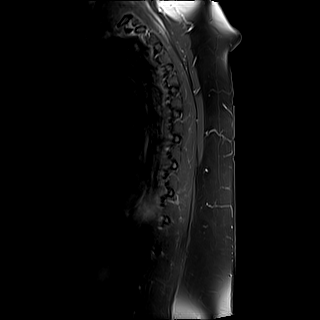

[Series 22: T1 fat-sat post-contrast · axial · 4.0mm · 0.39mm/px · z∈[-215,-51]mm · 3 of 40 slices shown (2 of 3)]
[im 1/40]
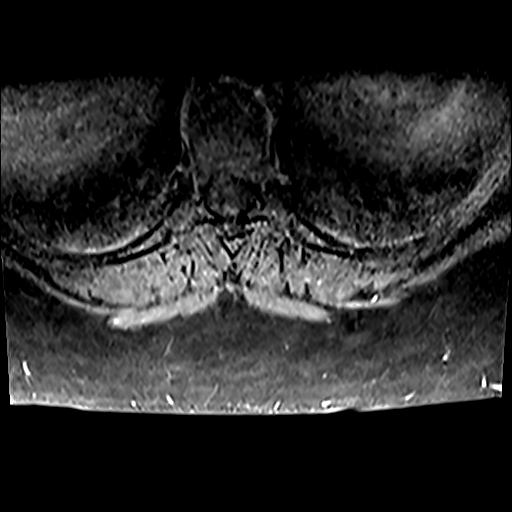
[im 20/40]
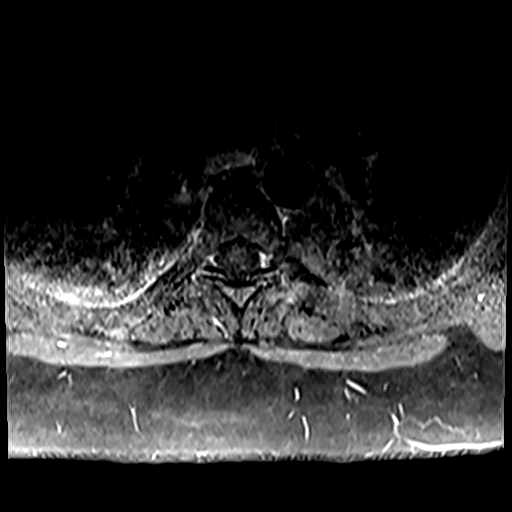
[im 40/40]
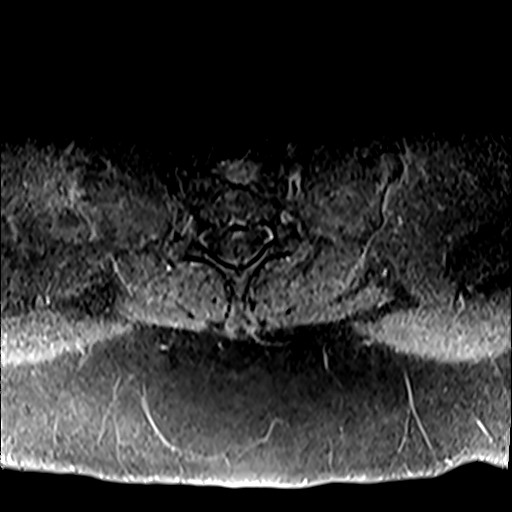

[Series 23: T1 fat-sat post-contrast · axial · 4.0mm · 0.39mm/px · z∈[-341,-172]mm · 3 of 40 slices shown (3 of 3)]
[im 1/40]
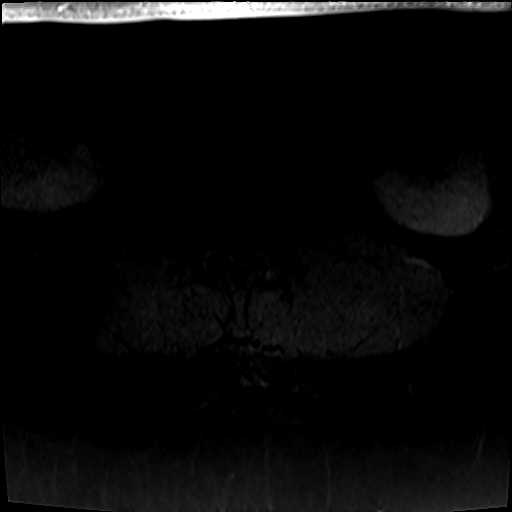
[im 20/40]
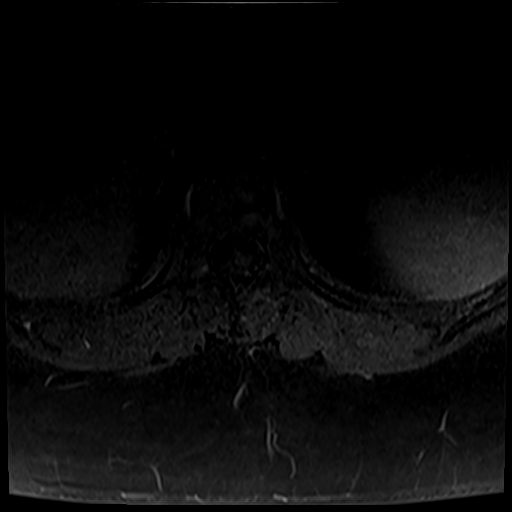
[im 40/40]
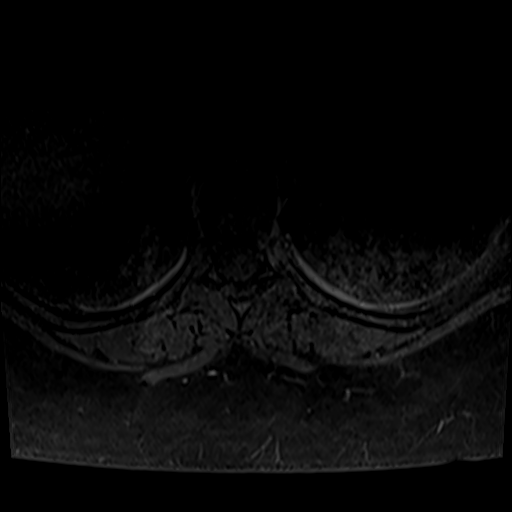

[38 of 48 positions shown; findings below may reference images not displayed]

FINDINGS: Alignment: Normal
Vertebral Bodies: Normal in height
Marrow Signal: Expected
Intervertebral Discs: Normal in height
Spinal cord: Normal
Paraspinal Soft Tissues: Normal
Individual Levels: No evidence of severe spinal canal stenosis, or neural foraminal narrowing.
No abnormal enhancement.
IMPRESSION: No acute findings in the thoracic spine.
Is the patient pregnant?
Unknown
